# Patient Record
Sex: Male | Born: 1960 | Race: Black or African American | Hispanic: No | Marital: Married | Smoking: Never smoker
Health system: Southern US, Community
[De-identification: ages and names within clinical notes are randomized; demographics above are authoritative.]

## PROBLEM LIST (undated history)

## (undated) DIAGNOSIS — G4733 Obstructive sleep apnea (adult) (pediatric): Secondary | ICD-10-CM

## (undated) DIAGNOSIS — E785 Hyperlipidemia, unspecified: Secondary | ICD-10-CM

## (undated) DIAGNOSIS — I509 Heart failure, unspecified: Secondary | ICD-10-CM

## (undated) DIAGNOSIS — K219 Gastro-esophageal reflux disease without esophagitis: Secondary | ICD-10-CM

## (undated) DIAGNOSIS — Z87442 Personal history of urinary calculi: Secondary | ICD-10-CM

## (undated) DIAGNOSIS — Z9989 Dependence on other enabling machines and devices: Secondary | ICD-10-CM

## (undated) DIAGNOSIS — I5042 Chronic combined systolic (congestive) and diastolic (congestive) heart failure: Secondary | ICD-10-CM

## (undated) DIAGNOSIS — E1142 Type 2 diabetes mellitus with diabetic polyneuropathy: Secondary | ICD-10-CM

## (undated) DIAGNOSIS — M109 Gout, unspecified: Secondary | ICD-10-CM

## (undated) DIAGNOSIS — E119 Type 2 diabetes mellitus without complications: Secondary | ICD-10-CM

## (undated) DIAGNOSIS — J45909 Unspecified asthma, uncomplicated: Secondary | ICD-10-CM

## (undated) DIAGNOSIS — I10 Essential (primary) hypertension: Secondary | ICD-10-CM

## (undated) HISTORY — PX: TONSILLECTOMY: SUR1361

## (undated) HISTORY — DX: Essential (primary) hypertension: I10

## (undated) HISTORY — DX: Morbid (severe) obesity due to excess calories: E66.01

## (undated) HISTORY — DX: Chronic combined systolic (congestive) and diastolic (congestive) heart failure: I50.42

## (undated) HISTORY — DX: Hyperlipidemia, unspecified: E78.5

---

## 1999-04-08 ENCOUNTER — Encounter: Payer: Self-pay | Admitting: Emergency Medicine

## 1999-04-08 ENCOUNTER — Emergency Department (HOSPITAL_COMMUNITY): Admission: EM | Admit: 1999-04-08 | Discharge: 1999-04-08 | Payer: Self-pay | Admitting: Emergency Medicine

## 2001-10-23 ENCOUNTER — Emergency Department (HOSPITAL_COMMUNITY): Admission: EM | Admit: 2001-10-23 | Discharge: 2001-10-24 | Payer: Self-pay | Admitting: Emergency Medicine

## 2001-10-23 ENCOUNTER — Encounter: Payer: Self-pay | Admitting: Emergency Medicine

## 2001-10-28 ENCOUNTER — Emergency Department (HOSPITAL_COMMUNITY): Admission: EM | Admit: 2001-10-28 | Discharge: 2001-10-28 | Payer: Self-pay | Admitting: Emergency Medicine

## 2001-11-14 HISTORY — PX: CARDIAC CATHETERIZATION: SHX172

## 2001-12-02 ENCOUNTER — Inpatient Hospital Stay (HOSPITAL_COMMUNITY): Admission: EM | Admit: 2001-12-02 | Discharge: 2001-12-04 | Payer: Self-pay | Admitting: Emergency Medicine

## 2001-12-02 ENCOUNTER — Encounter: Payer: Self-pay | Admitting: *Deleted

## 2001-12-30 ENCOUNTER — Ambulatory Visit (HOSPITAL_BASED_OUTPATIENT_CLINIC_OR_DEPARTMENT_OTHER): Admission: RE | Admit: 2001-12-30 | Discharge: 2001-12-30 | Payer: Self-pay | Admitting: Internal Medicine

## 2003-01-31 ENCOUNTER — Encounter: Payer: Self-pay | Admitting: Emergency Medicine

## 2003-01-31 ENCOUNTER — Emergency Department (HOSPITAL_COMMUNITY): Admission: EM | Admit: 2003-01-31 | Discharge: 2003-01-31 | Payer: Self-pay | Admitting: Emergency Medicine

## 2005-10-21 ENCOUNTER — Emergency Department (HOSPITAL_COMMUNITY): Admission: EM | Admit: 2005-10-21 | Discharge: 2005-10-21 | Payer: Self-pay | Admitting: Emergency Medicine

## 2010-12-17 ENCOUNTER — Emergency Department (HOSPITAL_COMMUNITY): Payer: PRIVATE HEALTH INSURANCE

## 2010-12-17 ENCOUNTER — Encounter (HOSPITAL_COMMUNITY): Payer: Self-pay | Admitting: Radiology

## 2010-12-17 ENCOUNTER — Inpatient Hospital Stay (HOSPITAL_COMMUNITY)
Admission: EM | Admit: 2010-12-17 | Discharge: 2010-12-18 | DRG: 313 | Disposition: A | Discharge status: Home / Self Care | Payer: PRIVATE HEALTH INSURANCE | Attending: Internal Medicine | Admitting: Internal Medicine

## 2010-12-17 DIAGNOSIS — I10 Essential (primary) hypertension: Secondary | ICD-10-CM | POA: Diagnosis present

## 2010-12-17 DIAGNOSIS — R0789 Other chest pain: Principal | ICD-10-CM | POA: Diagnosis present

## 2010-12-17 DIAGNOSIS — Z7982 Long term (current) use of aspirin: Secondary | ICD-10-CM

## 2010-12-17 DIAGNOSIS — G4733 Obstructive sleep apnea (adult) (pediatric): Secondary | ICD-10-CM | POA: Diagnosis present

## 2010-12-17 DIAGNOSIS — R209 Unspecified disturbances of skin sensation: Secondary | ICD-10-CM | POA: Diagnosis present

## 2010-12-17 DIAGNOSIS — M109 Gout, unspecified: Secondary | ICD-10-CM | POA: Diagnosis present

## 2010-12-17 DIAGNOSIS — E119 Type 2 diabetes mellitus without complications: Secondary | ICD-10-CM | POA: Diagnosis present

## 2010-12-17 LAB — POCT I-STAT, CHEM 8
BUN: 25 mg/dL — ABNORMAL HIGH (ref 6–23)
Calcium, Ion: 1.19 mmol/L (ref 1.12–1.32)
Chloride: 102 mEq/L (ref 96–112)
Creatinine, Ser: 1.4 mg/dL (ref 0.4–1.5)
Glucose, Bld: 204 mg/dL — ABNORMAL HIGH (ref 70–99)
HCT: 44 % (ref 39.0–52.0)
Hemoglobin: 15 g/dL (ref 13.0–17.0)
Potassium: 4.5 mEq/L (ref 3.5–5.1)
Sodium: 135 mEq/L (ref 135–145)
TCO2: 25 mmol/L (ref 0–100)

## 2010-12-17 LAB — DIFFERENTIAL
Basophils Absolute: 0 10*3/uL (ref 0.0–0.1)
Basophils Relative: 0 % (ref 0–1)
Eosinophils Absolute: 0.2 10*3/uL (ref 0.0–0.7)
Eosinophils Relative: 2 % (ref 0–5)
Lymphocytes Relative: 31 % (ref 12–46)
Lymphs Abs: 2.8 10*3/uL (ref 0.7–4.0)
Monocytes Absolute: 0.9 10*3/uL (ref 0.1–1.0)
Monocytes Relative: 11 % (ref 3–12)
Neutro Abs: 5 10*3/uL (ref 1.7–7.7)
Neutrophils Relative %: 56 % (ref 43–77)

## 2010-12-17 LAB — GLUCOSE, CAPILLARY
Glucose-Capillary: 141 mg/dL — ABNORMAL HIGH (ref 70–99)
Glucose-Capillary: 165 mg/dL — ABNORMAL HIGH (ref 70–99)

## 2010-12-17 LAB — POCT CARDIAC MARKERS
CKMB, poc: 5.4 ng/mL (ref 1.0–8.0)
Myoglobin, poc: 202 ng/mL (ref 12–200)
Troponin i, poc: <0.05 ng/mL (ref 0.00–0.09)

## 2010-12-17 LAB — D-DIMER, QUANTITATIVE: D-Dimer, Quant: 1.27 ug/mL-FEU — ABNORMAL HIGH (ref 0.00–0.48)

## 2010-12-17 LAB — CBC
HCT: 40.4 % (ref 39.0–52.0)
Hemoglobin: 13 g/dL (ref 13.0–17.0)
MCV: 95.5 fL (ref 78.0–100.0)
WBC: 8.9 10*3/uL (ref 4.0–10.5)

## 2010-12-17 LAB — TROPONIN I: Troponin I: 0.02 ng/mL (ref 0.00–0.06)

## 2010-12-17 MED ORDER — IOHEXOL 300 MG/ML  SOLN: 100.0000 mL | Freq: Once | INTRAMUSCULAR | Status: AC | PRN

## 2010-12-18 LAB — CBC
MCV: 95.8 fL (ref 78.0–100.0)
Platelets: 195 10*3/uL (ref 150–400)
RDW: 13.3 % (ref 11.5–15.5)
WBC: 7.6 10*3/uL (ref 4.0–10.5)

## 2010-12-18 LAB — COMPREHENSIVE METABOLIC PANEL
ALT: 39 U/L (ref 0–53)
Calcium: 9.4 mg/dL (ref 8.4–10.5)
GFR calc Af Amer: >60 mL/min (ref >60)
Glucose, Bld: 156 mg/dL — ABNORMAL HIGH (ref 70–99)
Sodium: 137 mEq/L (ref 135–145)
Total Protein: 7 g/dL (ref 6.0–8.3)

## 2010-12-18 LAB — GLUCOSE, CAPILLARY
Glucose-Capillary: 164 mg/dL — ABNORMAL HIGH (ref 70–99)
Glucose-Capillary: 182 mg/dL — ABNORMAL HIGH (ref 70–99)

## 2010-12-18 LAB — CK TOTAL AND CKMB (NOT AT ARMC): CK, MB: 3.5 ng/mL (ref 0.3–4.0)

## 2010-12-18 LAB — LIPID PANEL
Cholesterol: 207 mg/dL — ABNORMAL HIGH (ref 0–200)
HDL: 41 mg/dL (ref >39)
Total CHOL/HDL Ratio: 5 RATIO

## 2010-12-18 LAB — TROPONIN I: Troponin I: 0.01 ng/mL (ref 0.00–0.06)

## 2010-12-24 ENCOUNTER — Emergency Department (HOSPITAL_COMMUNITY)
Admission: EM | Admit: 2010-12-24 | Discharge: 2010-12-25 | Disposition: A | Discharge status: Home / Self Care | Payer: PRIVATE HEALTH INSURANCE | Attending: Emergency Medicine | Admitting: Emergency Medicine

## 2010-12-24 DIAGNOSIS — E785 Hyperlipidemia, unspecified: Secondary | ICD-10-CM | POA: Insufficient documentation

## 2010-12-24 DIAGNOSIS — E119 Type 2 diabetes mellitus without complications: Secondary | ICD-10-CM | POA: Insufficient documentation

## 2010-12-24 DIAGNOSIS — K219 Gastro-esophageal reflux disease without esophagitis: Secondary | ICD-10-CM | POA: Insufficient documentation

## 2010-12-24 DIAGNOSIS — Z7982 Long term (current) use of aspirin: Secondary | ICD-10-CM | POA: Insufficient documentation

## 2010-12-24 DIAGNOSIS — R079 Chest pain, unspecified: Secondary | ICD-10-CM | POA: Insufficient documentation

## 2010-12-24 DIAGNOSIS — Z79899 Other long term (current) drug therapy: Secondary | ICD-10-CM | POA: Insufficient documentation

## 2010-12-24 DIAGNOSIS — J3489 Other specified disorders of nose and nasal sinuses: Secondary | ICD-10-CM | POA: Insufficient documentation

## 2010-12-24 DIAGNOSIS — I10 Essential (primary) hypertension: Secondary | ICD-10-CM | POA: Insufficient documentation

## 2010-12-24 LAB — COMPREHENSIVE METABOLIC PANEL
Albumin: 4.2 g/dL (ref 3.5–5.2)
BUN: 26 mg/dL — ABNORMAL HIGH (ref 6–23)
Creatinine, Ser: 1.26 mg/dL (ref 0.4–1.5)
Total Bilirubin: 0.5 mg/dL (ref 0.3–1.2)
Total Protein: 7.7 g/dL (ref 6.0–8.3)

## 2010-12-24 LAB — CBC
MCH: 30 pg (ref 26.0–34.0)
MCV: 94.5 fL (ref 78.0–100.0)
Platelets: 223 10*3/uL (ref 150–400)
RDW: 13.4 % (ref 11.5–15.5)

## 2010-12-24 LAB — DIFFERENTIAL
Eosinophils Absolute: 0.2 10*3/uL (ref 0.0–0.7)
Eosinophils Relative: 2 % (ref 0–5)
Lymphs Abs: 3.2 10*3/uL (ref 0.7–4.0)
Monocytes Relative: 10 % (ref 3–12)

## 2010-12-24 LAB — POCT CARDIAC MARKERS: Troponin i, poc: <0.05 ng/mL (ref 0.00–0.09)

## 2010-12-28 NOTE — H&P (Signed)
  Miguel Ho, Miguel Ho             ACCOUNT NO.:  [id number]  MEDICAL RECORD NO.:  [id number]           PATIENT TYPE:  E  LOCATION:  [facility]                         FACILITY:  [facility]  PHYSICIAN:  Baltazar Najjar, MD     DATE OF BIRTH:  Jul 14, 1961  DATE OF ADMISSION:  12/17/2010 DATE OF DISCHARGE:                             HISTORY & PHYSICAL   ADDENDUM  Left-sided numbness without weakness, which was transient and nonspecific, however, given his risk factor, I will order for CT of the head.  He will also probably need TIA workup, so I will go ahead and order for carotid Doppler and a 2-D echocardiogram.  The patient is currently on aspirin, we will continue after checking his CT of head.  Dr. Jacinto Halim from [facility] was consulted and he will kindly see the patient in consultation.          ______________________________ Baltazar Najjar, MD     SA/MEDQ  D:  12/17/2010  T:  12/17/2010  Job:  [id number]  cc:   Della Goo, M.D.  Electronically Signed by Hannah Beat MD on 12/28/2010 08:22:13 PM

## 2011-01-20 NOTE — H&P (Signed)
NAMELOUIS, Miguel             ACCOUNT NO.:  [id number]  MEDICAL RECORD NO.:  [id number]           PATIENT TYPE:  E  LOCATION:  [facility]                         FACILITY:  [facility]  PHYSICIAN:  Celene Kras, MD       DATE OF BIRTH:  1961-02-22  DATE OF ADMISSION:  12/17/2010 DATE OF DISCHARGE:                             HISTORY & PHYSICAL   PRIMARY CARE PHYSICIAN:  Della Goo, MD  CODE STATUS:  The patient is full code.  REASON FOR ADMISSION:  Chest pain.  HISTORY OF PRESENT ILLNESS:  Ms. Miguel Ho is a 50 year old pleasant African American man with history of diabetes, hypertension, and nonischemic cardiomyopathy.  The patient came into the ER today with a chief complaint of chest pain started around 11 p.m. last night and he described his pain as chest pressure, mostly on his left side.  As per him, he slept overnight, wake up this morning, and his chest pain persists; however, the severity of the pain came down from about 7-8 last night to about 3 this morning.  As per him, he had transient episode of left upper extremity numbness associated with his pain and he felt dizzy and nauseated.  However, he was transient that went away in few seconds.  This morning, he has had left-sided numbness, which also resolved.  He denies any heartburn on a epigastric pain or any GI symptoms such as change in his bowel habits.  No nausea or vomiting. His episodes also associated with stranding shortness of breath as per him, which had resolved.  In the ED, his EKG was unremarkable.  His cardiac enzymes were unremarkable.  However, his D-dimer mildly elevated and CTA of the chest was ordered to rule out PE.  We were asked to see him for admission.  PAST MEDICAL HISTORY: 1. Diabetes mellitus type 2 2. Hypertension. 3. History of nonischemic cardiomyopathy, had cardiac catheterization     back in 2003, which showed normal coronaries. 4. History of gout. 5.  Hyperlipidemia.  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  Denies smoking or illicit drug use.  He drinks occasionally.  ALLERGIES:  No known drug allergies.  HOME MEDICATIONS:  The patient was unable to remember all his medication/dosages.  Medication reconciliation is pending at the time of dictation.  However as per him.  He was able to mention; 1. Glipizide. 2. Allopurinol. 3. Lisinopril. 4. Baby aspirin. 5. Cholesterol pills. 6. Lasix. 7. KCl.  FAMILY HISTORY:  Significant for cardiac disease.  His father died of heart attack and his father was also diabetic and hypertensive.  REVIEW OF SYSTEMS:  As above in HPI. CHEST:  Significant for shortness of breath transient.  No cough. CARDIOVASCULAR:  Chest pressure/pain.  No palpitation. ABDOMEN:  No nausea, vomiting, or change in bowel habits.  He had mild transient nausea, last night, which had resolved. GENITOURINARY: No burning micturition.  No urinary frequency or flank pain. CONSTITUTIONAL:  No fever or chills.  PHYSICAL EXAMINATION:  VITAL SIGNS:  Blood pressure 120/75, pulse 102, temperature was 98.2, and O2 sat 99% on  room air. GENERAL:  He is alert, not in acute distress. NECK:  Supple.  No JVD. LUNGS:  Clear to auscultation bilaterally. CARDIOVASCULAR:  S1 and S2.  Regular rhythm and rate. ABDOMEN:  Obese, soft, and nontender. EXTREMITIES:  No pedal edema. NEUROLOGIC:  He is alert and oriented x3.  No focal neurological deficit appreciated.  LABORATORY DATA:  Cardiac markers, troponin less than 0.05 x2 and D- dimer 1.27.  CBC showed hemoglobin of 13, hematocrit of 40, WBCs of 8.9, and platelet count 209.  Creatinine 1.4, BUN 25, glucose was 204, potassium 4.5, and sodium 135.  RADIOLOGY/IMAGING:  Chest x-ray showed borderline cardiomegaly without acute disease.  EKG showed sinus tachycardia with ventricular rate of 117 bpm.  ASSESSMENT AND PLAN:  Mr.  Miguel Ho is a 50 year old African American man with the above medical history presented with chest pain. 1. Chest pain.  The patient will be admitted to telemetry.  We will     continue to cycle his cardiac enzymes. 2. Given the fact that, the patient has significant risk factors being     a diabetic and hypertensive with a history of hyperlipidemia.  I     think he would benefit from Cardiology evaluation.  He will     probably need a stress test for evaluation and noted that the     patient had a previous history.  He had previous cardiac     catheterization back in 2003, that showed normal coronaries and it     was done by Dr. Jacinto Halim and I will consult him for to see him while     he is in the hospital. 3. Follow CTA to rule out PE given mildly elevated D-dimer. 4. We will continue with aspirin.  I will add small dose of beta-     blocker.  Continue with ACE inhibitor and statin. 5. Diabetes mellitus type 2.  Check his hemoglobin A1c.  Monitor CBGs     very closely.  Continue his home medicine and adjust p.r.n. 6. Hypertension.  Continue lisinopril at small dose of beta-blocker,     if his blood pressure can tolerate, and monitor adjust p.r.n. 7. History of nonischemic cardiomyopathy.  Cardiology be consulted for     further imaging/studies. 8. History of gout, on allopurinol.  No acute issues. 9. History of obstructive sleep apnea with CPAP.  We will continue     with CPAP at home. 10.Prophylaxis with heparin and Protonix. 11.Code status.  The patient is full code.    ______________________________ Baltazar Najjar, MD   ______________________________ Celene Kras, MD    SA/MEDQ  D:  12/17/2010  T:  12/17/2010  Job:  [id number]  cc:   Della Goo, M.D.  Electronically Signed by Hannah Beat MD on 01/19/2011 09:08:14 PM

## 2011-03-02 ENCOUNTER — Emergency Department (HOSPITAL_COMMUNITY)
Admission: EM | Admit: 2011-03-02 | Discharge: 2011-03-03 | Disposition: A | Discharge status: Home / Self Care | Payer: PRIVATE HEALTH INSURANCE | Attending: Emergency Medicine | Admitting: Emergency Medicine

## 2011-03-02 ENCOUNTER — Emergency Department (HOSPITAL_COMMUNITY): Payer: PRIVATE HEALTH INSURANCE

## 2011-03-02 DIAGNOSIS — E785 Hyperlipidemia, unspecified: Secondary | ICD-10-CM | POA: Insufficient documentation

## 2011-03-02 DIAGNOSIS — R0789 Other chest pain: Secondary | ICD-10-CM | POA: Insufficient documentation

## 2011-03-02 DIAGNOSIS — E119 Type 2 diabetes mellitus without complications: Secondary | ICD-10-CM | POA: Insufficient documentation

## 2011-03-02 DIAGNOSIS — I10 Essential (primary) hypertension: Secondary | ICD-10-CM | POA: Insufficient documentation

## 2011-03-02 DIAGNOSIS — E669 Obesity, unspecified: Secondary | ICD-10-CM | POA: Insufficient documentation

## 2011-03-02 LAB — POCT CARDIAC MARKERS: Myoglobin, poc: 109 ng/mL (ref 12–200)

## 2011-03-02 LAB — POCT I-STAT, CHEM 8
Creatinine, Ser: 1.3 mg/dL (ref 0.4–1.5)
HCT: 39 % (ref 39.0–52.0)
Hemoglobin: 13.3 g/dL (ref 13.0–17.0)
Sodium: 137 mEq/L (ref 135–145)
TCO2: 25 mmol/L (ref 0–100)

## 2011-03-03 LAB — POCT CARDIAC MARKERS
CKMB, poc: 2 ng/mL (ref 1.0–8.0)
Myoglobin, poc: 112 ng/mL (ref 12–200)

## 2011-03-14 NOTE — Discharge Summary (Signed)
  [name], Miguel Ho             ACCOUNT NO.:  [id number]  MEDICAL RECORD NO.:  [id number]           PATIENT TYPE:  I  LOCATION:  3731                         FACILITY:  [facility]  PHYSICIAN:  Hollice Espy, M.D.DATE OF BIRTH:  23-Feb-1961  DATE OF ADMISSION:  12/17/2010 DATE OF DISCHARGE:  12/18/2010                              DISCHARGE SUMMARY   PRIMARY CARE PHYSICIAN:  Della Goo, MD  DISCHARGE DIAGNOSES: 1. Chest pain felt to be musculoskeletal, noncardiac. 2. Left arm numbness, resolved, felt to be more musculoskeletal.  No     indications of any transient ischemic attack or cerebrovascular     accident. 3. Hypertension. 4. History of sleep apnea. 5. History of diabetes mellitus type 2.  Please note that all the diagnoses were present on admission.  DISCHARGE MEDICATIONS: 1. Aspirin 81. 2. Allopurinol 300. 3. Norvasc 2.5. 4. Fish oil 1400. 5. Lasix 40 mg daily as needed. 6. Glipizide 5 p.o. b.i.d. 7. K-Dur 20 mEq p.o. daily. 8. Lisinopril 10 p.o. daily. 9. Lovastatin 40 p.o. at bedtime.  DISCHARGE ACTIVITY:  Increase activity slowly.  DISCHARGE DIET:  Low-sodium diet.  FOLLOWUP:  The patient will follow up with the PCP, Dr. Della Goo, in the next 1 week.  Follow up with Dr. Jacinto Halim from [facility] in the next 1 week.  HOSPITAL COURSE:  The patient is a 50 year old African American male with a past medical history of diabetes, hypertension, and ischemic cardiomyopathy who came to the ER complaining of chest pain as well as some left arm numbness.  He was evaluated.  Cardiac enzymes x3 were negative.  He had a previous cardiac catheterization done in 2003 after his enzymes were negative.  It was discussed with Dr. Jacinto Halim who felt that the patient could follow up with an outpatient followup for consideration of a stress test at that time.  His other medical issues were stable during his hospitalization.  He had some arm numbness,  this was not felt to be that way cardiac related.  He had a CT scan of the head which was negative ruling out any kind of stroke.  There was a concern that this could be TIA related but evidence was not shown, so this patient was continued on his daily aspirin and then he was discharged to home with followup with his PCP as well as Dr. Jacinto Halim.  DISPOSITION:  Improved.     Hollice Espy, M.D.     SKK/MEDQ  D:  03/11/2011  T:  03/12/2011  Job:  [id number]  cc:   Della Goo, M.D. Cristy Hilts. Jacinto Halim, MD  Electronically Signed by Virginia Rochester M.D. on 03/14/2011 11:02:19 AM

## 2011-10-04 ENCOUNTER — Other Ambulatory Visit: Payer: Self-pay

## 2011-10-04 DIAGNOSIS — I739 Peripheral vascular disease, unspecified: Secondary | ICD-10-CM

## 2011-10-11 ENCOUNTER — Encounter: Payer: Self-pay | Admitting: Vascular Surgery

## 2011-10-26 ENCOUNTER — Encounter: Payer: Self-pay | Admitting: Vascular Surgery

## 2011-10-27 ENCOUNTER — Encounter: Payer: Self-pay | Admitting: Vascular Surgery

## 2011-10-27 ENCOUNTER — Ambulatory Visit (INDEPENDENT_AMBULATORY_CARE_PROVIDER_SITE_OTHER): Payer: PRIVATE HEALTH INSURANCE | Admitting: Vascular Surgery

## 2011-10-27 VITALS — BP 106/71 | HR 73 | Resp 20 | Ht 69.5 in | Wt 322.8 lb

## 2011-10-27 DIAGNOSIS — R200 Anesthesia of skin: Secondary | ICD-10-CM

## 2011-10-27 DIAGNOSIS — R209 Unspecified disturbances of skin sensation: Secondary | ICD-10-CM

## 2011-10-27 DIAGNOSIS — I739 Peripheral vascular disease, unspecified: Secondary | ICD-10-CM

## 2011-10-27 NOTE — Progress Notes (Signed)
ABI/TBI performed @ [facility] 10/27/2011

## 2011-10-27 NOTE — Progress Notes (Signed)
[facility] [place] HISTORY AND PHYSICAL   History of Present Illness:  Patient is a 50 y.o. year old male who presents for evaluation of numbness and tingling in his feet.  The patient has a history of diabetes for greater than 10 years. He has developed intermittent numbness and tingling in both feet over the last 2 months. He states his symptoms he thinks this is related to greasy foods. He does have a history of gout but states this is not the same pain. He has no trouble walking. He has no claudication symptoms. He has no rest pain. He has no history of nonhealing ulcers. He rides a stationary bicycle every morning. He is losing weight. He has gone from 340 pounds to 323 pounds. He is considering lap banding. He realizes that his obesity is contributing to his medical problems. Other medical problems include hypertension, sleep apnea in addition to the above problems. These are currently controlled and followed by his primary care physician.  Past Medical History  Diagnosis Date  . Hypertension   . History of gout   . History of sleep apnea   . Diabetes mellitus   . Numbness of toes     Past Surgical History  Procedure Date  . Cardiac catheterization 2003     Social History History  Substance Use Topics  . Smoking status: Never Smoker   . Smokeless tobacco: Not on file  . Alcohol Use: Yes     occasionally    Family History Family History  Problem Relation Age of Onset  . Heart attack Father   . Hypertension Father   . Diabetes Father   . Heart disease Father   . Cancer Sister     leukemia    Allergies  No Known Allergies   Current Outpatient Prescriptions  Medication Sig Dispense Refill  . allopurinol (ZYLOPRIM) 300 MG tablet Take 300 mg by mouth daily.        Marland Kitchen amLODipine (NORVASC) 2.5 MG tablet Take 2.5 mg by mouth at bedtime.        Marland Kitchen aspirin EC 81 MG tablet Take 81 mg by mouth daily.        . carvedilol (COREG) 6.25 MG tablet Take 6.25  mg by mouth 2 (two) times daily with a meal.        . furosemide (LASIX) 40 MG tablet Take 40 mg by mouth daily.        Marland Kitchen glipiZIDE (GLUCOTROL) 5 MG tablet Take 5 mg by mouth 2 (two) times daily before a meal.        . lisinopril (PRINIVIL,ZESTRIL) 10 MG tablet Take 10 mg by mouth daily.        Marland Kitchen lovastatin (MEVACOR) 40 MG tablet Take 40 mg by mouth at bedtime.        . Omega-3 Fatty Acids (FISH OIL TRIPLE STRENGTH) 1400 MG CAPS Take 1,400 mg by mouth daily.        . potassium chloride SA (K-DUR,KLOR-CON) 20 MEQ tablet Take 20 mEq by mouth daily.          ROS:   General:  + weight loss, Fever, chills  HEENT: No recent headaches, no nasal bleeding, no visual changes, no sore throat  Neurologic: No dizziness, blackouts, seizures. No recent symptoms of stroke or mini- stroke. No recent episodes of slurred speech, or temporary blindness.  Cardiac: occasional chest pain/pressure, no shortness of breath at rest.  No shortness of breath with exertion.  Denies  history of atrial fibrillation or irregular heartbeat  Vascular: No history of rest pain in feet.  No history of claudication.  No history of non-healing ulcer, No history of DVT   Pulmonary: No home oxygen, no productive cough, no hemoptysis,  No asthma or wheezing  Musculoskeletal:  [ ]  Arthritis, [ ]  Low back pain,  [ ]  Joint pain  Hematologic:No history of hypercoagulable state.  No history of easy bleeding.  No history of anemia  Gastrointestinal: No hematochezia or melena,  No gastroesophageal reflux, no trouble swallowing  Urinary: [ ]  chronic Kidney disease, [ ]  on HD - [ ]  MWF or [ ]  TTHS, [ ]  Burning with urination, [ ]  Frequent urination, [ ]  Difficulty urinating;   Skin: No rashes  Psychological: No history of anxiety,  No history of depression   Physical Examination  Filed Vitals:   10/27/11 1219  BP: 106/71  Pulse: 73  Resp: 20  Height: 5' 9.5" (1.765 m)  Weight: 322 lb 12.8 oz (146.421 kg)    Body mass  index is 46.99 kg/(m^2).  General:  Alert and oriented, no acute distress HEENT: Normal Neck: No bruit or JVD Pulmonary: Clear to auscultation bilaterally Cardiac: Regular Rate and Rhythm without murmur Abdomen: Soft, non-tender, non-distended, no mass, no scars, obese Skin: No rash Extremity Pulses:  2+ radial, brachial, femoral, dorsalis pedis, posterior tibial pulses bilaterally Musculoskeletal: No deformity or edema  Neurologic: Upper and lower extremity motor 5/5 and symmetric  DATA: The patient had bilateral ABIs today which I reviewed and interpreted. ABIs are greater than 1 bilaterally with triphasic waveforms. His toe pressure with slightly decreased but still within the normal range.   ASSESSMENT: Numbness and tingling of both feet most likely related to diabetic peripheral neuropathy. No evidence of large vessel arterial occlusive disease at this point.   PLAN: Patient was counseled and risk factor modification as well as continued weight loss. He'll try to control his diabetes. We'll check his feet daily. We will also plan moisturizing lotion to the feet prevent cracking. He will inspect his feet daily and avoid going barefoot. He will followup with me when necessary.  Fabienne Bruns, MD [facility] [place] Office: [phone] Pager: [phone]

## 2015-03-23 ENCOUNTER — Encounter: Payer: 59 | Attending: Internal Medicine | Admitting: Dietician

## 2015-03-23 ENCOUNTER — Encounter: Payer: Self-pay | Admitting: Dietician

## 2015-03-23 VITALS — Ht 69.5 in | Wt 303.0 lb

## 2015-03-23 DIAGNOSIS — E1165 Type 2 diabetes mellitus with hyperglycemia: Secondary | ICD-10-CM

## 2015-03-23 DIAGNOSIS — IMO0002 Reserved for concepts with insufficient information to code with codable children: Secondary | ICD-10-CM

## 2015-03-23 DIAGNOSIS — Z794 Long term (current) use of insulin: Secondary | ICD-10-CM | POA: Diagnosis not present

## 2015-03-23 DIAGNOSIS — Z713 Dietary counseling and surveillance: Secondary | ICD-10-CM | POA: Insufficient documentation

## 2015-03-23 DIAGNOSIS — E118 Type 2 diabetes mellitus with unspecified complications: Secondary | ICD-10-CM | POA: Diagnosis present

## 2015-03-23 NOTE — Patient Instructions (Signed)
Exercise most days of the week.  Walking or biking for 30-60 minutes each time as tolerated. Aim for 4-5 Carb Choices per meal (60-75 grams) +/- 1 either way  Aim for 0-2 Carbs per snack if hungry  Include protein in moderation with your meals and snacks Consider reading food labels for Total Carbohydrate and Fat Grams of foods Consider checking BG at alternate times per day as directed by MD  Consider taking medication as directed by MD

## 2015-03-24 NOTE — Progress Notes (Signed)
Diabetes Self-Management Education  Visit Type:    Appt. Start Time: 1545 Appt. End Time: 1700  03/24/2015  [name], identified by name and date of birth, is a 54 y.o. male with a diagnosis of Diabetes: Type 2.  Other people present during visit:  Patient   Patient reports hx of type 2 diabetes for >10 years.  HgbA1C increased from 7.5% 11/12/15 to 9.6% 02/12/15.  Patiend states that he has decreased sugar containing beverages.  Reports that when he eats red meat or spicy foods he has increased symptoms of neuropathy.  Follows a reduced sodium diet and uses a low sodium seasoning blend and Mrs. Dash.  Hx also includes OSA on C-Pap.  Patient did not use C-Pap last night and had a difficult time staying awake during his appointment.  Current meds include:  Victoza 1.5 mg after dinner,  Levemir 60 units before breakfast, and Short acting insulin 10 units before breakfast if blood sugar is high (>160).  Patient reports that his weight was 320 lbs 1 year ago.  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  He does his own cooking and shopping.    ASSESSMENT  Height 5' 9.5" (1.765 m), weight 303 lb (137.44 kg). Body mass index is 44.12 kg/(m^2).  Initial Visit Information:  Are you currently following a meal plan?: Yes What type of meal plan do you follow?: trying to avoid a lot of starches Are you taking your medications as prescribed?: Yes Are you checking your feet?: Yes How many days per week are you checking your feet?: 7 How often do you need to have someone help you when you read instructions, pamphlets, or other written materials from your doctor or pharmacy?: 1 - Never What is the last grade level you completed in school?: 12 grade HS  Psychosocial:   Patient Belief/Attitude about Diabetes: Motivated to manage diabetes Self-care barriers: None Self-management support: Doctor's office, Family Other persons present: Patient Patient Concerns: Nutrition/Meal planning, Healthy  Lifestyle Special Needs: None Preferred Learning Style: No preference indicated Learning Readiness: Ready  Complications:   Last HgB A1C per patient/outside source: 9.6 mg/dL (4/62/70) How often do you check your blood sugar?: 1-2 times/day Fasting Blood glucose range (mg/dL): 350-093, 818-299 (371-696) Postprandial Blood glucose range (mg/dL): 78-938 (101-751) Number of hypoglycemic episodes per month: 0 Number of hyperglycemic episodes per week: 14 Can you tell when your blood sugar is high?: Yes What do you do if your blood sugar is high?: numb feet, "puffy/swollen" Have you had a dilated eye exam in the past 12 months?: No Have you had a dental exam in the past 12 months?: Yes  Diet Intake:  Breakfast: 2 boiled eggs and water or lplain oatmeal with stevia or splenda or eggs, bacon and grits or occasional french toast with sugar free syrup with 2% milk, rare unsweetened cereal with milk Lunch: pickle and lima beans (1100) Snack (afternoon): nabs or chicken spinach wrap  (1300) Dinner: [place], rice with gravy butter beans Snack (evening): pickle or meat  Beverage(s): water, sugar free coolaide, crystal light, rare coffee with cream and 3 packs sugar or splenda, occasional iced tea with lemonaide  Exercise:  Exercise: Light (walking / raking leaves) (walking or stationary bike) Light Exercise amount of time (min / week): 150  Individualized Plan for Diabetes Self-Management Training:   Learning Objective:  Patient will have a greater understanding of diabetes self-management.  Patient education plan per assessed needs and concerns is to attend individual sessions  for     Education Topics Reviewed with Patient Today:  Definition of diabetes, type 1 and 2, and the diagnosis of diabetes Role of diet in the treatment of diabetes and the relationship between the three main macronutrients and blood glucose level, Food label reading, portion sizes and measuring food., Carbohydrate  counting Role of exercise on diabetes management, blood pressure control and cardiac health.       Relationship between chronic complications and blood glucose control, Dental care, Retinopathy and reason for yearly dilated eye exams        PATIENTS GOALS/Plan (Developed by the patient):  Nutrition: General guidelines for healthy choices and portions discussed, Follow meal plan discussed Physical Activity: Exercise 5-7 days per week, 45 minutes per day Medications: take my medication as prescribed Monitoring : test my blood glucose as discussed (note x per day with comment) (bid)  Plan:   Patient Instructions  Exercise most days of the week.  Walking or biking for 30-60 minutes each time as tolerated. Aim for 4-5 Carb Choices per meal (60-75 grams) +/- 1 either way  Aim for 0-2 Carbs per snack if hungry  Include protein in moderation with your meals and snacks Consider reading food labels for Total Carbohydrate and Fat Grams of foods Consider checking BG at alternate times per day as directed by MD  Consider taking medication as directed by MD    Expected Outcomes:  Demonstrated interest in learning. Expect positive outcomes  Education material provided: Living Well with Diabetes, Food label handouts, A1C conversion sheet, Meal plan card and Snack sheet  If problems or questions, patient to contact team via:  Phone and Email  Future DSME appointment: PRN

## 2015-09-18 ENCOUNTER — Telehealth: Payer: Self-pay | Admitting: Cardiovascular Disease

## 2015-09-18 NOTE — Telephone Encounter (Signed)
Received records from Dr Ralene Ok for appointment on 09/23/15 with Dr Duke Salvia.  Records given to [facility] (medical records) for Dr Leonides Sake schedule on 09/23/15. lp

## 2015-09-22 NOTE — Progress Notes (Signed)
This encounter was created in error - please disregard.

## 2015-09-23 ENCOUNTER — Encounter: Payer: PRIVATE HEALTH INSURANCE | Admitting: Cardiovascular Disease

## 2015-09-29 ENCOUNTER — Encounter: Payer: Self-pay | Admitting: Cardiovascular Disease

## 2015-10-09 NOTE — Progress Notes (Signed)
Cardiology Office Note   Date:  10/12/2015   ID:  BRODE SCULLEY, DOB 02-18-1961, MRN [id number]  PCP:  Ron Parker, MD  Cardiologist:   Madilyn Hook, MD   Chief Complaint  Patient presents with  . New Evaluation    pt states no chest pain no SOB no swelling in legs no light headedness or dizziness      History of Present Illness: Miguel Ho is a 54 y.o. malewith hypertension, hyperlipidemia, OSA, and diabetes who presents for management of chronic systolic heart failure.  Mr. Merkey reports that he was diagnosed with systolic heart failure several years ago. He thinks it was in the early 2000 but is unsure. He reports undergoing cardiac catheterization that did not reveal any significant blockages.  E followed up with a cardiologist for short period of time but has not been seen in several years.  He reported this to his primary care physician who recommended that he see a cardiologist.  Mr. Zukowski notes occasional lower extremity edema that occurs based on what he eats.  He likes to eat a lot o fpork and reports that this increases his edema. He denies any chest pain, shortness of breath, or orthopnea.  He previously lost weight to a nadir of 230 pounds. However he has gained much of this weight back due to lack of activity and poor eating habits. He denies weight gain due to edema. He does not get much exercise and reports that his job is very strenuous. 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   Past Medical History  Diagnosis Date  . Hypertension   . History of gout   . History of sleep apnea   . Diabetes mellitus   . Numbness of toes   . History of high cholesterol   . Essential hypertension 10/12/2015  . Hyperlipidemia 10/12/2015  . Morbid obesity (HCC) 10/12/2015  . Chronic systolic heart failure (HCC) 10/12/2015  . Diabetes mellitus (HCC) 10/12/2015    Past Surgical History  Procedure Laterality Date  . Cardiac  catheterization  2003     Current Outpatient Prescriptions  Medication Sig Dispense Refill  . allopurinol (ZYLOPRIM) 300 MG tablet Take 300 mg by mouth daily.      Marland Kitchen aspirin EC 81 MG tablet Take 81 mg by mouth daily.      . carvedilol (COREG) 6.25 MG tablet Take 6.25 mg by mouth 2 (two) times daily with a meal.      . furosemide (LASIX) 40 MG tablet Take 40 mg by mouth daily.      . Gabapentin, Once-Daily, 300 MG TABS Take 300 mg by mouth daily.    Marland Kitchen glipiZIDE (GLUCOTROL) 5 MG tablet Take 5 mg by mouth 2 (two) times daily before a meal.      . insulin detemir (LEVEMIR) 100 UNIT/ML injection Inject 60 Units into the skin daily before breakfast.    . lisinopril (PRINIVIL,ZESTRIL) 10 MG tablet Take 10 mg by mouth daily.      Marland Kitchen lovastatin (MEVACOR) 40 MG tablet Take 40 mg by mouth at bedtime.      . Omega-3 Fatty Acids (FISH OIL TRIPLE STRENGTH) 1400 MG CAPS Take 1,400 mg by mouth daily.      . potassium chloride (K-DUR) 10 MEQ tablet Take 10 mEq by mouth 2 (two) times daily.    . potassium chloride SA (K-DUR,KLOR-CON) 20 MEQ tablet Take 20 mEq by mouth daily.  No current facility-administered medications for this visit.    Allergies:   Review of patient's allergies indicates no known allergies.    Social History:  The patient  reports that he has never smoked. He does not have any smokeless tobacco history on file. He reports that he drinks alcohol. He reports that he does not use illicit drugs.   Family History:  The patient's family history includes Cancer in his sister; Diabetes in his father; Heart attack in his father, mother, and paternal grandmother; Heart disease in his father; Hypertension in his father.    ROS:  Please see the history of present illness.   Otherwise, review of systems are positive for none.   All other systems are reviewed and negative.    PHYSICAL EXAM: VS:  BP 142/90 mmHg  Pulse 105  Ht 5' 9.5" (1.765 m)  Wt 142.566 kg (314 lb 4.8 oz)  BMI 45.76  kg/m2 , BMI Body mass index is 45.76 kg/(m^2). GENERAL:  Well appearing HEENT:  Pupils equal round and reactive, fundi not visualized, oral mucosa unremarkable NECK:  No jugular venous distention, waveform within normal limits, carotid upstroke brisk and symmetric, no bruits, no thyromegaly LYMPHATICS:  No cervical adenopathy LUNGS:  Clear to auscultation bilaterally HEART:  RRR.  PMI not displaced or sustained,S1 and S2 within normal limits, no S3, no S4, no clicks, no rubs, no murmurs ABD:  Flat, positive bowel sounds normal in frequency in pitch, no bruits, no rebound, no guarding, no midline pulsatile mass, no hepatomegaly, no splenomegaly EXT:  2 plus pulses throughout, trace edema, no cyanosis no clubbing SKIN:  No rashes no nodules NEURO:  Cranial nerves II through XII grossly intact, motor grossly intact throughout PSYCH:  Cognitively intact, oriented to person place and time    EKG:  EKG is ordered today. The ekg ordered today demonstrates Sinus tachycardia rate 105 bpm.  low voltage in the limb and precordial leads.  .   Recent Labs: No results found for requested labs within last 365 days.    Lipid Panel    Component Value Date/Time   CHOL * 12/18/2010 0425    207        ATP III CLASSIFICATION:  <200     mg/dL   Desirable  161-096  mg/dL   Borderline High  >=045    mg/dL   High          TRIG 409* 12/18/2010 0425   HDL 41 12/18/2010 0425   CHOLHDL 5.0 12/18/2010 0425   VLDL 70* 12/18/2010 0425   LDLCALC  12/18/2010 0425    96        Total Cholesterol/HDL:CHD Risk Coronary Heart Disease Risk Table                     Men   Women  1/2 Average Risk   3.4   3.3  Average Risk       5.0   4.4  2 X Average Risk   9.6   7.1  3 X Average Risk  23.4   11.0        Use the calculated Patient Ratio above and the CHD Risk Table to determine the patient's CHD Risk.        ATP III CLASSIFICATION (LDL):  <100     mg/dL   Optimal  811-914  mg/dL   Near or Above  Optimal  130-159  mg/dL   Borderline  629-476  mg/dL   High  >546     mg/dL   Very High      Wt Readings from Last 3 Encounters:  10/12/15 142.566 kg (314 lb 4.8 oz)  03/23/15 137.44 kg (303 lb)  10/27/11 146.421 kg (322 lb 12.8 oz)      ASSESSMENT AND PLAN:  # Chronic systolic heart failure: Mr. Bielec appears to be euvolemic on exam today.  It sounds as though he has a non-ischemic cardiomyopathy.  We will obtain an echo to assess his systolic function.  He is already on carvedilol and lisinopril.His blood pressure is slightly elevated today. We will wait to get the results of his echo before titrating one of these medicines or adding spironolactone. He is to continue Lasix at current dosing.  # Hypertension: Blood pressure poorly-controlled as above.  We will obtain his echo results prior to titrating his antihypertensives.  # Hyperlipidemia: Continue lovastatin.  Managed by his PCP.  # Obesity: we discussed the importance of increasing his physical activity to 30-40 minutes most days of the week.  He also admits to having a poor diet, as evidenced by his hemoglobin A1c of 9.5%.  We discussed the importance of weight loss and glycemic control.  He plans to lose 10 lb by the next appointment in 6 months.  Current medicines are reviewed at length with the patient today.  The patient does not have concerns regarding medicines.  The following changes have been made:  no change  Labs/ tests ordered today include:   Orders Placed This Encounter  Procedures  . EKG 12-Lead  . ECHOCARDIOGRAM COMPLETE     Disposition:   FU with Raelle Chambers C. Duke Salvia, MD in 6 months.    Signed, Madilyn Hook, MD  10/12/2015 1:54 PM    [facility]

## 2015-10-12 ENCOUNTER — Encounter: Payer: Self-pay | Admitting: Cardiovascular Disease

## 2015-10-12 ENCOUNTER — Ambulatory Visit (INDEPENDENT_AMBULATORY_CARE_PROVIDER_SITE_OTHER): Payer: 59 | Admitting: Cardiovascular Disease

## 2015-10-12 VITALS — BP 142/90 | HR 105 | Ht 69.5 in | Wt 314.3 lb

## 2015-10-12 DIAGNOSIS — I10 Essential (primary) hypertension: Secondary | ICD-10-CM

## 2015-10-12 DIAGNOSIS — Z794 Long term (current) use of insulin: Secondary | ICD-10-CM

## 2015-10-12 DIAGNOSIS — I5042 Chronic combined systolic (congestive) and diastolic (congestive) heart failure: Secondary | ICD-10-CM | POA: Insufficient documentation

## 2015-10-12 DIAGNOSIS — E785 Hyperlipidemia, unspecified: Secondary | ICD-10-CM

## 2015-10-12 DIAGNOSIS — E0800 Diabetes mellitus due to underlying condition with hyperosmolarity without nonketotic hyperglycemic-hyperosmolar coma (NKHHC): Secondary | ICD-10-CM

## 2015-10-12 DIAGNOSIS — E119 Type 2 diabetes mellitus without complications: Secondary | ICD-10-CM | POA: Insufficient documentation

## 2015-10-12 DIAGNOSIS — I5022 Chronic systolic (congestive) heart failure: Secondary | ICD-10-CM

## 2015-10-12 HISTORY — DX: Essential (primary) hypertension: I10

## 2015-10-12 HISTORY — DX: Hyperlipidemia, unspecified: E78.5

## 2015-10-12 HISTORY — DX: Morbid (severe) obesity due to excess calories: E66.01

## 2015-10-12 HISTORY — DX: Chronic combined systolic (congestive) and diastolic (congestive) heart failure: I50.42

## 2015-10-12 NOTE — Patient Instructions (Addendum)
Medication Instructions:  Your physician recommends that you continue on your current medications as directed. Please refer to the Current Medication list given to you today.  Labwork: none  Testing/Procedures: Your physician has requested that you have an echocardiogram. Echocardiography is a painless test that uses sound waves to create images of your heart. It provides your doctor with information about the size and shape of your heart and how well your heart's chambers and valves are working. This procedure takes approximately one hour. There are no restrictions for this procedure. THIS WILL BE DONE AT [address]  Follow-Up: Your physician wants you to follow-up in: 6 month ov You will receive a reminder letter in the mail two months in advance. If you don't receive a letter, please call our office to schedule the follow-up appointment.

## 2015-10-23 ENCOUNTER — Other Ambulatory Visit: Payer: Self-pay

## 2015-10-23 ENCOUNTER — Ambulatory Visit (HOSPITAL_COMMUNITY): Payer: 59 | Attending: Cardiology

## 2015-10-23 DIAGNOSIS — G4733 Obstructive sleep apnea (adult) (pediatric): Secondary | ICD-10-CM | POA: Insufficient documentation

## 2015-10-23 DIAGNOSIS — E119 Type 2 diabetes mellitus without complications: Secondary | ICD-10-CM | POA: Insufficient documentation

## 2015-10-23 DIAGNOSIS — I5022 Chronic systolic (congestive) heart failure: Secondary | ICD-10-CM | POA: Diagnosis present

## 2015-10-23 DIAGNOSIS — E785 Hyperlipidemia, unspecified: Secondary | ICD-10-CM | POA: Insufficient documentation

## 2015-10-23 DIAGNOSIS — I10 Essential (primary) hypertension: Secondary | ICD-10-CM | POA: Insufficient documentation

## 2015-11-04 ENCOUNTER — Telehealth: Payer: Self-pay | Admitting: *Deleted

## 2015-11-04 NOTE — Telephone Encounter (Signed)
Left message to call back  

## 2015-11-04 NOTE — Telephone Encounter (Signed)
Spoke to patient. echo Result given . Verbalized understanding  blood pressure should be below 140/90 most of the time

## 2015-11-04 NOTE — Telephone Encounter (Signed)
Pt is returning Sharon's call ° °Thanks °

## 2015-11-04 NOTE — Telephone Encounter (Signed)
-----   Message from Miguel Si, MD sent at 11/03/2015 12:30 AM EST ----- Echo shows LVEF is 40-45%, which is lower than expected.  It also shows that his heart does not relax completely.  It will be important keep his blood pressure well-controlled.  Keep follow up as scheduled.

## 2016-04-05 NOTE — Progress Notes (Signed)
Cardiology Office Note   Date:  04/08/2016   ID:  Miguel Ho, DOB Jul 07, 1961, MRN [id number]  PCP:  Ralene Ok, MD  Cardiologist:   Chilton Si, MD   Chief Complaint  Patient presents with  . Follow-up    6 months  pt c/o mild swelling in feet depending on what he eats      History of Present Illness: Miguel Ho is a 55 y.o. male with hypertension, hyperlipidemia, OSA, and diabetes who presents for management of chronic systolic heart failure.  Miguel Ho reports that he was diagnosed with systolic heart failure in the early 2000s. He reports undergoing cardiac catheterization that did not reveal any significant blockages.  He followed up with a cardiologist for short period of time but has not been seen in several years.  He had an echo 12/16 that revealed LVEF 40-45% and hypokinesis of the mid-apical anterolateral myocardium.  There was grade 1 diastolic dysfunction.  Overall Miguel Ho has been doing well.  He has noted some LE edema after eating certain foods.  He tries to follow the DASH diet and seasons his food with garlic rather than salt.  He denies orthopnea or PND.   Miguel Ho has also made other dietary changes including eating more oatmeal and drinkng water while reducing sugary foods and drinks.  He notes that his weight is down 16lb, mostly due to dietary changes.  He has not been exercising, though he has a bicycle and a treadmill.   Miguel Ho has a CPAP but does not feel ike the dose is appropriate.  He uses his CPAP off and on.     Past Medical History  Diagnosis Date  . Hypertension   . History of gout   . History of sleep apnea   . Diabetes mellitus   . Numbness of toes   . History of high cholesterol   . Essential hypertension 10/12/2015  . Hyperlipidemia 10/12/2015  . Morbid obesity (HCC) 10/12/2015  . Chronic systolic heart failure (HCC) 10/12/2015  . Diabetes mellitus (HCC) 10/12/2015  . 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 Cardiac catheterization  2003     Current Outpatient Prescriptions  Medication Sig Dispense Refill  . allopurinol (ZYLOPRIM) 300 MG tablet Take 300 mg by mouth daily.      Marland Kitchen aspirin EC 81 MG tablet Take 81 mg by mouth daily.      . carvedilol (COREG) 6.25 MG tablet Take 6.25 mg by mouth 2 (two) times daily with a meal.      . furosemide (LASIX) 40 MG tablet Take 40 mg by mouth daily.      . Gabapentin, Once-Daily, 300 MG TABS Take 300 mg by mouth daily.    Marland Kitchen LEVEMIR FLEXTOUCH 100 UNIT/ML Pen Inject 50-70 Units into the skin daily.  4  . lisinopril (PRINIVIL,ZESTRIL) 10 MG tablet Take 10 mg by mouth daily.      Marland Kitchen lovastatin (MEVACOR) 40 MG tablet Take 40 mg by mouth at bedtime.      . Omega-3 Fatty Acids (FISH OIL TRIPLE STRENGTH) 1400 MG CAPS Take 1,400 mg by mouth daily.      . potassium chloride (K-DUR) 10 MEQ tablet Take 10 mEq by mouth 2 (two) times daily.    Marland Kitchen VICTOZA 18 MG/3ML SOPN Inject 1.8 mg into the skin daily.  3   No current facility-administered medications for this  visit.    Allergies:   Review of patient's allergies indicates no known allergies.    Social History:  The patient  reports that he has never smoked. He does not have any smokeless tobacco history on file. He reports that he drinks alcohol. He reports that he does not use illicit drugs.   Family History:  The patient's family history includes Cancer in his sister; Diabetes in his father; Heart attack in his father, mother, and paternal grandmother; Heart disease in his father; Hypertension in his father.    ROS:  Please see the history of present illness.   Otherwise, review of systems are positive for none.   All other systems are reviewed and negative.    PHYSICAL EXAM: VS:  BP 92/70 mmHg  Pulse 90  Ht 5\' 9"  (1.753 m)  Wt 135.444 kg (298 lb 9.6 oz)  BMI 44.08 kg/m2 , BMI Body mass index is 44.08  kg/(m^2). GENERAL:  Well appearing HEENT:  Pupils equal round and reactive, fundi not visualized, oral mucosa unremarkable NECK:  No jugular venous distention, waveform within normal limits, carotid upstroke brisk and symmetric, no bruits, no thyromegaly LYMPHATICS:  No cervical adenopathy LUNGS:  Clear to auscultation bilaterally HEART:  RRR.  PMI not displaced or sustained,S1 and S2 within normal limits, no S3, no S4, no clicks, no rubs, no murmurs ABD:  Flat, positive bowel sounds normal in frequency in pitch, no bruits, no rebound, no guarding, no midline pulsatile mass, no hepatomegaly, no splenomegaly EXT:  2 plus pulses throughout, trace edema, no cyanosis no clubbing SKIN:  No rashes no nodules NEURO:  Cranial nerves II through XII grossly intact, motor grossly intact throughout PSYCH:  Cognitively intact, oriented to person place and time   EKG:  EKG is ordered today. The ekg ordered today demonstrates Sinus tachycardia rate 105 bpm.  low voltage in the limb and precordial leads.   Echo 10/23/15: Study Conclusions  - Left ventricle: The cavity size was normal. Wall thickness was  increased in a pattern of mild LVH. Systolic function was mildly  to moderately reduced. The estimated ejection fraction was in the  range of 40% to 45%. There is hypokinesis of the  mid-apicalanterolateral myocardium. Doppler parameters are  consistent with abnormal left ventricular relaxation (grade 1  diastolic dysfunction). .   Recent Labs: No results found for requested labs within last 365 days.    Lipid Panel    Component Value Date/Time   CHOL * 12/18/2010 0425    207        ATP III CLASSIFICATION:  <200     mg/dL   Desirable  161-096  mg/dL   Borderline High  >=045    mg/dL   High          TRIG 409* 12/18/2010 0425   HDL 41 12/18/2010 0425   CHOLHDL 5.0 12/18/2010 0425   VLDL 70* 12/18/2010 0425   LDLCALC  12/18/2010 0425    96        Total Cholesterol/HDL:CHD  Risk Coronary Heart Disease Risk Table                     Men   Women  1/2 Average Risk   3.4   3.3  Average Risk       5.0   4.4  2 X Average Risk   9.6   7.1  3 X Average Risk  23.4   11.0  Use the calculated Patient Ratio above and the CHD Risk Table to determine the patient's CHD Risk.        ATP III CLASSIFICATION (LDL):  <100     mg/dL   Optimal  356-701  mg/dL   Near or Above                    Optimal  130-159  mg/dL   Borderline  410-301  mg/dL   High  >314     mg/dL   Very High      Wt Readings from Last 3 Encounters:  04/07/16 135.444 kg (298 lb 9.6 oz)  10/12/15 142.566 kg (314 lb 4.8 oz)  03/23/15 137.44 kg (303 lb)      ASSESSMENT AND PLAN:  # Chronic systolic heart failure: Miguel. Bixler appears to be euvolemic on exam today.  LVEF is 40-45%.  He has a non-ischemic cardiomyopathy.  We will obtain an echo to assess his systolic function.  BP is low but he denies lower dizziness or lightheadedness.  Continue lisinopril and carvedilol.  BP is too low to add spironolactone.  Continue lasix 40 mg daily.  # Hypertension: Blood pressure is well-controlled.  It is low but he is asymptomatic.  Continue carvedilol and lisinopril.  # Hyperlipidemia: Continue lovastatin.  Managed by his PCP.  # Obesity: Miguel. Sawin has done a good job of losing weight and improving his diet.  He was congratulated on these efforts.  We discussed the importance of increasing his physical activity to 30-40 minutes most days of the week.   # OSA: CPAP is not fitting well.  This is likely due to his weight loss.  We will refer him for a titration study.   Current medicines are reviewed at length with the patient today.  The patient does not have concerns regarding medicines.  The following changes have been made:  no change  Labs/ tests ordered today include:   Orders Placed This Encounter  Procedures  . Split night study    Labs from PCP  Disposition:   FU with Sheleen Conchas C.  Duke Salvia, MD in 6 months.    Signed, Chilton Si, MD  04/08/2016 8:49 PM    [facility]

## 2016-04-07 ENCOUNTER — Encounter: Payer: Self-pay | Admitting: Cardiovascular Disease

## 2016-04-07 ENCOUNTER — Ambulatory Visit (INDEPENDENT_AMBULATORY_CARE_PROVIDER_SITE_OTHER): Payer: BLUE CROSS/BLUE SHIELD | Admitting: Cardiovascular Disease

## 2016-04-07 VITALS — BP 92/70 | HR 90 | Ht 69.0 in | Wt 298.6 lb

## 2016-04-07 DIAGNOSIS — I10 Essential (primary) hypertension: Secondary | ICD-10-CM

## 2016-04-07 DIAGNOSIS — Z9989 Dependence on other enabling machines and devices: Principal | ICD-10-CM

## 2016-04-07 DIAGNOSIS — I5042 Chronic combined systolic (congestive) and diastolic (congestive) heart failure: Secondary | ICD-10-CM

## 2016-04-07 DIAGNOSIS — G4733 Obstructive sleep apnea (adult) (pediatric): Secondary | ICD-10-CM | POA: Diagnosis not present

## 2016-04-07 DIAGNOSIS — E785 Hyperlipidemia, unspecified: Secondary | ICD-10-CM

## 2016-04-07 NOTE — Patient Instructions (Addendum)
Medication Instructions:  Your physician recommends that you continue on your current medications as directed. Please refer to the Current Medication list given to you today.  Labwork: Requested from you PCP  Testing/Procedures: Your physician has recommended that you have a sleep study. This test records several body functions during sleep, including: brain activity, eye movement, oxygen and carbon dioxide blood levels, heart rate and rhythm, breathing rate and rhythm, the flow of air through your mouth and nose, snoring, body muscle movements, and chest and belly movement.  Follow-Up: Your physician wants you to follow-up in: 6 month ov You will receive a reminder letter in the mail two months in advance. If you don't receive a letter, please call our office to schedule the follow-up appointment.  If you need a refill on your cardiac medications before your next appointment, please call your pharmacy.

## 2016-04-08 ENCOUNTER — Encounter: Payer: Self-pay | Admitting: Cardiovascular Disease

## 2016-05-29 ENCOUNTER — Encounter (HOSPITAL_BASED_OUTPATIENT_CLINIC_OR_DEPARTMENT_OTHER): Payer: BLUE CROSS/BLUE SHIELD

## 2017-11-09 ENCOUNTER — Encounter (HOSPITAL_COMMUNITY): Payer: Self-pay | Admitting: *Deleted

## 2017-11-09 ENCOUNTER — Other Ambulatory Visit (HOSPITAL_COMMUNITY): Payer: PRIVATE HEALTH INSURANCE

## 2017-11-09 ENCOUNTER — Ambulatory Visit (HOSPITAL_COMMUNITY): Payer: PRIVATE HEALTH INSURANCE

## 2017-11-09 ENCOUNTER — Other Ambulatory Visit: Payer: Self-pay

## 2017-11-09 ENCOUNTER — Emergency Department (HOSPITAL_COMMUNITY): Payer: PRIVATE HEALTH INSURANCE

## 2017-11-09 ENCOUNTER — Inpatient Hospital Stay (HOSPITAL_COMMUNITY)
Admission: EM | Admit: 2017-11-09 | Discharge: 2017-11-13 | DRG: 286 | Disposition: A | Discharge status: Home / Self Care | Payer: PRIVATE HEALTH INSURANCE | Attending: Internal Medicine | Admitting: Internal Medicine

## 2017-11-09 ENCOUNTER — Inpatient Hospital Stay (HOSPITAL_COMMUNITY): Payer: PRIVATE HEALTH INSURANCE

## 2017-11-09 DIAGNOSIS — N179 Acute kidney failure, unspecified: Secondary | ICD-10-CM | POA: Diagnosis present

## 2017-11-09 DIAGNOSIS — Z7982 Long term (current) use of aspirin: Secondary | ICD-10-CM

## 2017-11-09 DIAGNOSIS — J9601 Acute respiratory failure with hypoxia: Secondary | ICD-10-CM | POA: Diagnosis present

## 2017-11-09 DIAGNOSIS — E1122 Type 2 diabetes mellitus with diabetic chronic kidney disease: Secondary | ICD-10-CM | POA: Diagnosis present

## 2017-11-09 DIAGNOSIS — I10 Essential (primary) hypertension: Secondary | ICD-10-CM | POA: Diagnosis present

## 2017-11-09 DIAGNOSIS — I159 Secondary hypertension, unspecified: Secondary | ICD-10-CM | POA: Diagnosis not present

## 2017-11-09 DIAGNOSIS — Z794 Long term (current) use of insulin: Secondary | ICD-10-CM | POA: Diagnosis not present

## 2017-11-09 DIAGNOSIS — IMO0002 Reserved for concepts with insufficient information to code with codable children: Secondary | ICD-10-CM

## 2017-11-09 DIAGNOSIS — I5043 Acute on chronic combined systolic (congestive) and diastolic (congestive) heart failure: Secondary | ICD-10-CM | POA: Diagnosis present

## 2017-11-09 DIAGNOSIS — Z8669 Personal history of other diseases of the nervous system and sense organs: Secondary | ICD-10-CM | POA: Diagnosis not present

## 2017-11-09 DIAGNOSIS — M109 Gout, unspecified: Secondary | ICD-10-CM | POA: Diagnosis present

## 2017-11-09 DIAGNOSIS — Z8249 Family history of ischemic heart disease and other diseases of the circulatory system: Secondary | ICD-10-CM | POA: Diagnosis not present

## 2017-11-09 DIAGNOSIS — E1165 Type 2 diabetes mellitus with hyperglycemia: Secondary | ICD-10-CM | POA: Diagnosis present

## 2017-11-09 DIAGNOSIS — Z833 Family history of diabetes mellitus: Secondary | ICD-10-CM

## 2017-11-09 DIAGNOSIS — G4733 Obstructive sleep apnea (adult) (pediatric): Secondary | ICD-10-CM | POA: Diagnosis present

## 2017-11-09 DIAGNOSIS — Z806 Family history of leukemia: Secondary | ICD-10-CM | POA: Diagnosis not present

## 2017-11-09 DIAGNOSIS — N189 Chronic kidney disease, unspecified: Secondary | ICD-10-CM | POA: Diagnosis not present

## 2017-11-09 DIAGNOSIS — E785 Hyperlipidemia, unspecified: Secondary | ICD-10-CM | POA: Diagnosis present

## 2017-11-09 DIAGNOSIS — I130 Hypertensive heart and chronic kidney disease with heart failure and stage 1 through stage 4 chronic kidney disease, or unspecified chronic kidney disease: Secondary | ICD-10-CM | POA: Diagnosis present

## 2017-11-09 DIAGNOSIS — Z6841 Body Mass Index (BMI) 40.0 and over, adult: Secondary | ICD-10-CM

## 2017-11-09 DIAGNOSIS — N183 Chronic kidney disease, stage 3 (moderate): Secondary | ICD-10-CM | POA: Diagnosis present

## 2017-11-09 DIAGNOSIS — I428 Other cardiomyopathies: Secondary | ICD-10-CM | POA: Diagnosis present

## 2017-11-09 DIAGNOSIS — E1142 Type 2 diabetes mellitus with diabetic polyneuropathy: Secondary | ICD-10-CM | POA: Diagnosis present

## 2017-11-09 HISTORY — DX: Gout, unspecified: M10.9

## 2017-11-09 HISTORY — DX: Obstructive sleep apnea (adult) (pediatric): G47.33

## 2017-11-09 HISTORY — DX: Gastro-esophageal reflux disease without esophagitis: K21.9

## 2017-11-09 HISTORY — DX: Unspecified asthma, uncomplicated: J45.909

## 2017-11-09 HISTORY — DX: Dependence on other enabling machines and devices: Z99.89

## 2017-11-09 HISTORY — DX: Type 2 diabetes mellitus without complications: E11.9

## 2017-11-09 HISTORY — DX: Type 2 diabetes mellitus with diabetic polyneuropathy: E11.42

## 2017-11-09 LAB — GLUCOSE, CAPILLARY
Glucose-Capillary: 209 mg/dL — ABNORMAL HIGH (ref 65–99)
Glucose-Capillary: 141 mg/dL — ABNORMAL HIGH (ref 65–99)
Glucose-Capillary: 184 mg/dL — ABNORMAL HIGH (ref 65–99)

## 2017-11-09 LAB — COMPREHENSIVE METABOLIC PANEL
ALT: 34 U/L (ref 17–63)
ANION GAP: 9 (ref 5–15)
AST: 35 U/L (ref 15–41)
Albumin: 3.1 g/dL — ABNORMAL LOW (ref 3.5–5.0)
Alkaline Phosphatase: 75 U/L (ref 38–126)
BILIRUBIN TOTAL: 0.6 mg/dL (ref 0.3–1.2)
BUN: 28 mg/dL — ABNORMAL HIGH (ref 6–20)
CO2: 20 mmol/L — ABNORMAL LOW (ref 22–32)
Calcium: 8.8 mg/dL — ABNORMAL LOW (ref 8.9–10.3)
Chloride: 106 mmol/L (ref 101–111)
Creatinine, Ser: 1.64 mg/dL — ABNORMAL HIGH (ref 0.61–1.24)
GFR calc Af Amer: 52 mL/min — ABNORMAL LOW (ref >60)
GFR, EST NON AFRICAN AMERICAN: 45 mL/min — AB (ref >60)
Glucose, Bld: 313 mg/dL — ABNORMAL HIGH (ref 65–99)
POTASSIUM: 4.5 mmol/L (ref 3.5–5.1)
Sodium: 135 mmol/L (ref 135–145)
TOTAL PROTEIN: 6.6 g/dL (ref 6.5–8.1)

## 2017-11-09 LAB — I-STAT TROPONIN, ED: Troponin i, poc: 0.02 ng/mL (ref 0.00–0.08)

## 2017-11-09 LAB — CBC
HCT: 41.3 % (ref 39.0–52.0)
Hemoglobin: 13.1 g/dL (ref 13.0–17.0)
MCH: 30.6 pg (ref 26.0–34.0)
MCHC: 31.7 g/dL (ref 30.0–36.0)
MCV: 96.5 fL (ref 78.0–100.0)
PLATELETS: 186 10*3/uL (ref 150–400)
RBC: 4.28 MIL/uL (ref 4.22–5.81)
RDW: 13.9 % (ref 11.5–15.5)
WBC: 7.4 10*3/uL (ref 4.0–10.5)

## 2017-11-09 LAB — I-STAT VENOUS BLOOD GAS, ED
ACID-BASE DEFICIT: 6 mmol/L — AB (ref 0.0–2.0)
Bicarbonate: 20.5 mmol/L (ref 20.0–28.0)
O2 Saturation: 95 %
PH VEN: 7.28 (ref 7.250–7.430)
TCO2: 22 mmol/L (ref 22–32)
pCO2, Ven: 43.7 mmHg — ABNORMAL LOW (ref 44.0–60.0)
pO2, Ven: 85 mmHg — ABNORMAL HIGH (ref 32.0–45.0)

## 2017-11-09 LAB — ECHOCARDIOGRAM COMPLETE
Ao-asc: 35 cm
Area-P 1/2: 7.33 cm2
CHL CUP MV DEC (S): 102
EWDT: 102 ms
FS: 12 % — AB (ref 28–44)
Height: 69 in
IVS/LV PW RATIO, ED: 0.99
LA ID, A-P, ES: 45 mm
LA diam end sys: 45 mm
LA vol: 88.3 mL
LADIAMINDEX: 1.66 cm/m2
LAVOLA4C: 99.7 mL
LAVOLIN: 32.6 mL/m2
LDCA: 3.8 cm2
LVOTD: 22 mm
MV Peak grad: 7 mmHg
MV pk A vel: 39.4 m/s
MV pk E vel: 130 m/s
P 1/2 time: 30 ms
PW: 11.5 mm — AB (ref 0.6–1.1)
RV TAPSE: 23.6 mm
Weight: 5020.8 oz

## 2017-11-09 LAB — HEMOGLOBIN A1C
HEMOGLOBIN A1C: 7.4 % — AB (ref 4.8–5.6)
MEAN PLASMA GLUCOSE: 165.68 mg/dL

## 2017-11-09 LAB — I-STAT CG4 LACTIC ACID, ED
LACTIC ACID, VENOUS: 2.01 mmol/L — AB (ref 0.5–1.9)
Lactic Acid, Venous: 2.18 mmol/L (ref 0.5–1.9)

## 2017-11-09 LAB — CBG MONITORING, ED: Glucose-Capillary: 207 mg/dL — ABNORMAL HIGH (ref 65–99)

## 2017-11-09 LAB — HIV ANTIBODY (ROUTINE TESTING W REFLEX): HIV SCREEN 4TH GENERATION: NONREACTIVE

## 2017-11-09 LAB — LACTIC ACID, PLASMA: Lactic Acid, Venous: 2 mmol/L (ref 0.5–1.9)

## 2017-11-09 LAB — BRAIN NATRIURETIC PEPTIDE: B Natriuretic Peptide: 48.5 pg/mL (ref 0.0–100.0)

## 2017-11-09 MED ORDER — ALLOPURINOL 300 MG PO TABS
300.0000 mg | ORAL_TABLET | Freq: Every day | ORAL | Status: DC
  Administered 2017-11-09 – 2017-11-13 (×5): 300 mg via ORAL
  Filled 2017-11-09 (×5): qty 1

## 2017-11-09 MED ORDER — ASPIRIN EC 81 MG PO TBEC
81.0000 mg | DELAYED_RELEASE_TABLET | Freq: Every day | ORAL | Status: DC
  Administered 2017-11-09 – 2017-11-12 (×4): 81 mg via ORAL
  Filled 2017-11-09 (×4): qty 1

## 2017-11-09 MED ORDER — SODIUM CHLORIDE 0.9% FLUSH
3.0000 mL | Freq: Two times a day (BID) | INTRAVENOUS | Status: DC
  Administered 2017-11-09 – 2017-11-12 (×7): 3 mL via INTRAVENOUS

## 2017-11-09 MED ORDER — PRAVASTATIN SODIUM 40 MG PO TABS
40.0000 mg | ORAL_TABLET | Freq: Every day | ORAL | Status: DC
  Administered 2017-11-09 – 2017-11-12 (×4): 40 mg via ORAL
  Filled 2017-11-09 (×4): qty 1

## 2017-11-09 MED ORDER — SODIUM CHLORIDE 0.9% FLUSH
3.0000 mL | INTRAVENOUS | Status: DC | PRN
Start: 2017-11-09 — End: 2017-11-13

## 2017-11-09 MED ORDER — OMEGA-3-ACID ETHYL ESTERS 1 G PO CAPS
1.0000 g | ORAL_CAPSULE | Freq: Two times a day (BID) | ORAL | Status: DC
  Administered 2017-11-09 – 2017-11-12 (×8): 1 g via ORAL
  Filled 2017-11-09 (×9): qty 1

## 2017-11-09 MED ORDER — ACETAMINOPHEN 325 MG PO TABS: 650.0000 mg | ORAL_TABLET | ORAL | Status: DC | PRN

## 2017-11-09 MED ORDER — INSULIN ASPART 100 UNIT/ML ~~LOC~~ SOLN: 0.0000 [IU] | Freq: Every day | SUBCUTANEOUS | Status: DC

## 2017-11-09 MED ORDER — LIRAGLUTIDE 18 MG/3ML ~~LOC~~ SOPN
1.8000 mg | PEN_INJECTOR | Freq: Every day | SUBCUTANEOUS | Status: DC
  Administered 2017-11-11 – 2017-11-13 (×3): 1.8 mg via SUBCUTANEOUS
  Filled 2017-11-09 (×2): qty 3

## 2017-11-09 MED ORDER — ONDANSETRON HCL 4 MG/2ML IJ SOLN: 4.0000 mg | Freq: Four times a day (QID) | INTRAMUSCULAR | Status: DC | PRN

## 2017-11-09 MED ORDER — INSULIN ASPART 100 UNIT/ML ~~LOC~~ SOLN
0.0000 [IU] | Freq: Three times a day (TID) | SUBCUTANEOUS | Status: DC
  Administered 2017-11-09: 3 [IU] via SUBCUTANEOUS
  Administered 2017-11-09: 1 [IU] via SUBCUTANEOUS
  Administered 2017-11-09: 3 [IU] via SUBCUTANEOUS
  Administered 2017-11-10 – 2017-11-12 (×6): 2 [IU] via SUBCUTANEOUS
  Administered 2017-11-12: 1 [IU] via SUBCUTANEOUS
  Filled 2017-11-09: qty 1

## 2017-11-09 MED ORDER — POTASSIUM CHLORIDE CRYS ER 20 MEQ PO TBCR
20.0000 meq | EXTENDED_RELEASE_TABLET | Freq: Two times a day (BID) | ORAL | Status: DC
  Administered 2017-11-09 – 2017-11-13 (×9): 20 meq via ORAL
  Filled 2017-11-09 (×9): qty 1

## 2017-11-09 MED ORDER — SODIUM CHLORIDE 0.9 % IV SOLN: 250.0000 mL | INTRAVENOUS | Status: DC | PRN

## 2017-11-09 MED ORDER — GABAPENTIN 300 MG PO CAPS
300.0000 mg | ORAL_CAPSULE | Freq: Every day | ORAL | Status: DC
  Administered 2017-11-09 – 2017-11-12 (×4): 300 mg via ORAL
  Filled 2017-11-09 (×4): qty 1

## 2017-11-09 MED ORDER — NITROGLYCERIN IN D5W 200-5 MCG/ML-% IV SOLN
0.0000 ug/min | Freq: Once | INTRAVENOUS | Status: AC
  Administered 2017-11-09: 5 ug/min via INTRAVENOUS
  Filled 2017-11-09: qty 250

## 2017-11-09 MED ORDER — ENOXAPARIN SODIUM 40 MG/0.4ML ~~LOC~~ SOLN
40.0000 mg | SUBCUTANEOUS | Status: DC
  Administered 2017-11-09 – 2017-11-10 (×2): 40 mg via SUBCUTANEOUS
  Filled 2017-11-09 (×2): qty 0.4

## 2017-11-09 MED ORDER — NITROGLYCERIN 2 % TD OINT
0.5000 [in_us] | TOPICAL_OINTMENT | Freq: Four times a day (QID) | TRANSDERMAL | Status: DC
  Administered 2017-11-09 – 2017-11-13 (×15): 0.5 [in_us] via TOPICAL
  Filled 2017-11-09: qty 30

## 2017-11-09 MED ORDER — NITROGLYCERIN 2 % TD OINT
1.0000 [in_us] | TOPICAL_OINTMENT | Freq: Once | TRANSDERMAL | Status: AC
  Administered 2017-11-09: 1 [in_us] via TOPICAL

## 2017-11-09 MED ORDER — FUROSEMIDE 10 MG/ML IJ SOLN
40.0000 mg | INTRAMUSCULAR | Status: AC
  Administered 2017-11-09: 40 mg via INTRAVENOUS
  Filled 2017-11-09: qty 4

## 2017-11-09 MED ORDER — NITROGLYCERIN 2 % TD OINT
1.0000 [in_us] | TOPICAL_OINTMENT | Freq: Once | TRANSDERMAL | Status: AC
  Administered 2017-11-09: 1 [in_us] via TOPICAL
  Filled 2017-11-09: qty 1

## 2017-11-09 MED ORDER — FUROSEMIDE 10 MG/ML IJ SOLN
40.0000 mg | Freq: Two times a day (BID) | INTRAMUSCULAR | Status: DC
  Administered 2017-11-09 – 2017-11-10 (×2): 40 mg via INTRAVENOUS
  Filled 2017-11-09 (×2): qty 4

## 2017-11-09 MED ORDER — CARVEDILOL 6.25 MG PO TABS
6.2500 mg | ORAL_TABLET | Freq: Two times a day (BID) | ORAL | Status: DC
  Administered 2017-11-09 – 2017-11-13 (×9): 6.25 mg via ORAL
  Filled 2017-11-09 (×9): qty 1

## 2017-11-09 MED ORDER — INSULIN DETEMIR 100 UNIT/ML ~~LOC~~ SOLN
70.0000 [IU] | Freq: Every day | SUBCUTANEOUS | Status: DC
  Administered 2017-11-09 – 2017-11-13 (×5): 70 [IU] via SUBCUTANEOUS
  Filled 2017-11-09 (×5): qty 0.7

## 2017-11-09 NOTE — ED Notes (Signed)
The pt is sleeping 

## 2017-11-09 NOTE — ED Provider Notes (Signed)
[name] EMERGENCY DEPARTMENT Provider Note   CSN: [id number] Arrival date & time: 11/09/17  0441     History   Chief Complaint Chief Complaint  Patient presents with  . Shortness of Breath    HPI Miguel Ho is a 56 y.o. male.  56yo M w/ PMH including CHF w/ EF 40-45% in 2016, T2DM, HTN, HLD, OSA who p/w shortness of breath.  Tonight around 4:45 AM the patient woke up with sudden shortness of breath and respiratory distress.  When EMS arrived he was diaphoretic, short of breath, and hypoxic to 80% on room air.  He was started on CPAP and transported here.  He denies any chest pain, states that he was feeling okay earlier today but has noted some recent nominal and lower extremity he had he has been compliant with his medications.  His doctor took him off of one blood pressure medication recently.  He denies any cough/cold symptoms or recent illness.  LEVEL 5 CAVEAT DUE TO RESPIRATORY DISTRESS   The history is provided by the patient and the EMS personnel.  Shortness of Breath     Past Medical History:  Diagnosis Date  . Chronic combined systolic and diastolic heart failure (HCC) 10/12/2015  . Chronic systolic heart failure (HCC) 10/12/2015  . Diabetes mellitus   . Diabetes mellitus (HCC) 10/12/2015  . Essential hypertension 10/12/2015  . History of gout   . History of high cholesterol   . History of sleep apnea   . Hyperlipidemia 10/12/2015  . Hypertension   . Morbid obesity (HCC) 10/12/2015  . Numbness of toes     Patient Active Problem List   Diagnosis Date Noted  . Essential hypertension 10/12/2015  . Hyperlipidemia 10/12/2015  . Morbid obesity (HCC) 10/12/2015  . Chronic combined systolic and diastolic heart failure (HCC) 10/12/2015  . Diabetes mellitus (HCC) 10/12/2015    Past Surgical History:  Procedure Laterality Date  . CARDIAC CATHETERIZATION  2003       Home Medications    Prior to Admission medications     Medication Sig Start Date End Date Taking? Authorizing Provider  allopurinol (ZYLOPRIM) 300 MG tablet Take 300 mg by mouth daily.     Yes [provider]  aspirin EC 81 MG tablet Take 81 mg by mouth daily.     Yes [provider]  carvedilol (COREG) 6.25 MG tablet Take 6.25 mg by mouth 2 (two) times daily with a meal.     Yes [provider]  furosemide (LASIX) 40 MG tablet Take 40 mg by mouth daily.     Yes [provider]  gabapentin (NEURONTIN) 300 MG capsule Take 300 mg by mouth at bedtime. 09/14/17  Yes [provider]  LEVEMIR FLEXTOUCH 100 UNIT/ML Pen Inject 70 Units into the skin daily.  03/28/16  Yes [provider]  lovastatin (MEVACOR) 40 MG tablet Take 40 mg by mouth at bedtime.     Yes [provider]  Omega-3 Fatty Acids (FISH OIL TRIPLE STRENGTH) 1400 MG CAPS Take 1,400 mg by mouth daily.     Yes [provider]  potassium chloride (K-DUR) 10 MEQ tablet Take 10 mEq by mouth daily.    Yes [provider]  VICTOZA 18 MG/3ML SOPN Inject 1.8 mg into the skin daily. 03/21/16  Yes [provider]    Family History Family History  Problem Relation Age of Onset  . Heart attack Father  deceased age 57  . Hypertension Father   . Diabetes Father   . Heart disease Father   . Cancer Sister        leukemia  . Heart attack Mother        deceased age 57  . Heart attack Paternal Grandmother     Social History Social History   Tobacco Use  . Smoking status: Never Smoker  . Smokeless tobacco: Never Used  Substance Use Topics  . Alcohol use: Yes    Comment: occasionally  . Drug use: No     Allergies   Patient has no known allergies.   Review of Systems Review of Systems  Unable to perform ROS: Severe respiratory distress  Respiratory: Positive for shortness of breath.      Physical Exam Updated Vital Signs BP (!) 133/98   Pulse (!) 102   Temp (!) 97.4 F (36.3 C)   Resp  (!) 9   Ht 5\' 9"  (1.753 m)   Wt 136.1 kg (300 lb)   SpO2 93%   BMI 44.30 kg/m   Physical Exam  Constitutional: He is oriented to person, place, and time. He appears well-developed and well-nourished. He appears distressed.  HENT:  Head: Normocephalic and atraumatic.  Moist mucous membranes  Eyes: Conjunctivae are normal.  Neck: Neck supple.  Cardiovascular: Regular rhythm and normal heart sounds. Tachycardia present.  No murmur heard. Pulmonary/Chest: Accessory muscle usage present. Tachypnea noted. He is in respiratory distress. He has rales (all lung fields).  On bipap  Abdominal: Soft. Bowel sounds are normal. He exhibits distension. There is no tenderness.  Musculoskeletal:       Right lower leg: He exhibits edema.       Left lower leg: He exhibits edema.  Neurological: He is alert and oriented to person, place, and time.  Fluent speech  Skin: Skin is warm. He is diaphoretic.  Psychiatric: Judgment normal. His mood appears anxious.  Nursing note and vitals reviewed.    ED Treatments / Results  Labs (all labs ordered are listed, but only abnormal results are displayed) Labs Reviewed  COMPREHENSIVE METABOLIC PANEL - Abnormal; Notable for the following components:      Result Value   CO2 20 (*)    Glucose, Bld 313 (*)    BUN 28 (*)    Creatinine, Ser 1.64 (*)    Calcium 8.8 (*)    Albumin 3.1 (*)    GFR calc non Af Amer 45 (*)    GFR calc Af Amer 52 (*)    All other components within normal limits  I-STAT CG4 LACTIC ACID, ED - Abnormal; Notable for the following components:   Lactic Acid, Venous 2.18 (*)    All other components within normal limits  I-STAT VENOUS BLOOD GAS, ED - Abnormal; Notable for the following components:   pCO2, Ven 43.7 (*)    pO2, Ven 85.0 (*)    Acid-base deficit 6.0 (*)    All other components within normal limits  BRAIN NATRIURETIC PEPTIDE  CBC  HEMOGLOBIN A1C  I-STAT TROPONIN, ED  I-STAT CG4 LACTIC ACID, ED    EKG  EKG  Interpretation  Date/Time:  Thursday November 09 2017 04:47:06 EST Ventricular Rate:  112 PR Interval:    QRS Duration: 90 QT Interval:  348 QTC Calculation: 475 R Axis:   86 Text Interpretation:  Sinus tachycardia Borderline T abnormalities, inferior leads No significant change since last tracing Confirmed by Frederick PeersLittle, Katora Fini [phone]([id number]) on 11/09/2017 5:01:47 AM  Also confirmed by Frederick Peers [id number]), editor Elita Quick [phone])  on 11/09/2017 6:37:25 AM       Radiology Dg Chest Port 1 View  Result Date: 11/09/2017 CLINICAL DATA:  Acute onset of shortness of breath and cough. Decreased O2 saturation. Diaphoresis. EXAM: PORTABLE CHEST 1 VIEW COMPARISON:  Chest radiograph performed 03/02/2011 FINDINGS: The lungs are well-aerated. Vascular congestion is noted. Bilateral central airspace opacities raise concern for pulmonary edema. There is no evidence of pleural effusion or pneumothorax. The cardiomediastinal silhouette is within normal limits. No acute osseous abnormalities are seen. IMPRESSION: Vascular congestion. Bilateral central airspace opacities raise concern for pulmonary edema. Electronically Signed   By: Roanna Raider M.D.   On: 11/09/2017 05:03    Procedures .Critical Care Performed by: Laurence Spates, MD Authorized by: Laurence Spates, MD   Critical care provider statement:    Critical care time (minutes):  30   Critical care time was exclusive of:  Separately billable procedures and treating other patients   Critical care was necessary to treat or prevent imminent or life-threatening deterioration of the following conditions:  Cardiac failure and respiratory failure   Critical care was time spent personally by me on the following activities:  Development of treatment plan with patient or surrogate, evaluation of patient's response to treatment, examination of patient, obtaining history from patient or surrogate, ordering and performing treatments and  interventions, ordering and review of laboratory studies, ordering and review of radiographic studies, re-evaluation of patient's condition and review of old charts   (including critical care time)  Medications Ordered in ED Medications  furosemide (LASIX) injection 40 mg (not administered)  insulin aspart (novoLOG) injection 0-9 Units (not administered)  insulin aspart (novoLOG) injection 0-5 Units (not administered)  nitroGLYCERIN (NITROGLYN) 2 % ointment 1 inch (1 inch Topical Given 11/09/17 0504)  nitroGLYCERIN 50 mg in dextrose 5 % 250 mL (0.2 mg/mL) infusion (0 mcg/min Intravenous Stopped 11/09/17 0712)  furosemide (LASIX) injection 40 mg (40 mg Intravenous Given 11/09/17 0509)     Initial Impression / Assessment and Plan / ED Course  I have reviewed the triage vital signs and the nursing notes.  Pertinent labs & imaging results that were available during my care of the patient were reviewed by me and considered in my medical decision making (see chart for details).     PT w/ sudden SOB, hypoxia, on bipap on arrival.  In mild respiratory distress but with normal O2 saturation on BiPAP.  No chest pain.  Evidence of volume overload with lower extremity edema and abdominal distention.  Gave IV lasix, started NTG drip.  VBG and troponin reassuring, creatinine is elevated from baseline, 1.64 today.  Chest x-ray shows pulmonary edema and his story is consistent with CHF exacerbation.  After 2 hours on BiPAP, his work of breathing significantly improved and he was comfortable on reassessment.  Switched to nasal cannula and stop nitroglycerin drip, switched to Nitropaste.  Discussed admission with hospitalist, Revonda Standard, and patient admitted for diuresis and further workup.  Final Clinical Impressions(s) / ED Diagnoses   Final diagnoses:  None    ED Discharge Orders    None       Kinzlie Harney, Ambrose Finland, MD 11/09/17 [id number]

## 2017-11-09 NOTE — Progress Notes (Signed)
  On call notified of lactic acid of 2.0. No new orders at this time

## 2017-11-09 NOTE — ED Notes (Signed)
Dr little took the pt off bi-pap and placed on nasal 02 for  A trial  Nitro still at 18mcg/min

## 2017-11-09 NOTE — ED Notes (Signed)
bp lower  Nitro drip at orifinal start it has not been increased

## 2017-11-09 NOTE — ED Notes (Signed)
The pt breathing  He is better  His urine is more dilute

## 2017-11-09 NOTE — ED Notes (Signed)
NITRO PASTE RFEMOVED IV NITRO BEGINNING

## 2017-11-09 NOTE — ED Notes (Signed)
Nitro drip stopped nitro paste 1 "  Lt upper chest  Pt voided another  charted

## 2017-11-09 NOTE — Progress Notes (Signed)
  Echocardiogram 2D Echocardiogram has been performed.  Evens Meno G Kyonna Frier 11/09/2017, 5:01 PM

## 2017-11-09 NOTE — ED Notes (Signed)
Dr. Caleb Popp notified that lactic resulted at 2.01

## 2017-11-09 NOTE — H&P (Signed)
History and Physical    Miguel Ho IWL:[id number] DOB: [birth year] DOA: 11/09/2017   PCP: Ralene Ok, MD   Attending physician: Caleb Popp  Patient coming from/Resides with: Private residence  Chief Complaint: Shortness of breath  HPI: Miguel Ho is a 56 y.o. male with medical history significant for obesity, OSA on CPAP, hypertension, gout, known chronic combined systolic and diastolic heart failure, dyslipidemia and diabetes on insulin.  Patient was in the usual state of health until he awakened early this morning with significant shortness of breath and wheezing.  He wore his CPAP to bed per his usual routine.  His dyspnea was marked enough that he was unable to make it to the car to try to come to the ER so EMS was called.  Upon EMS arrival the patient's O2 saturations were noted to be 80%.  Upon arrival to the ER he was tachycardic, hypertensive and hypoxemic.  Chest x-ray revealed vascular congestion consistent with heart failure exacerbation.  He was initially given Nitropaste but given his acute respiratory symptoms this was changed to a nitro drip at 5 mcg/min.  He was placed on BiPAP with 40% FiO2.  He was given Lasix 40 mg IV x1.  Since that time patient's respiratory status has stabilized.  His blood pressure has decreased to normal ranges.  He has diuresed greater than 1 L.  And he has been transitioned from BiPAP to 3 L of oxygen.  Patient will be admitted to telemetry with a diagnosis of acute on chronic systolic and diastolic heart failure.  Note BNP was not elevated.  ED Course:  Vital Signs: BP (!) 134/97   Pulse (!) 102   Temp (!) 97.4 F (36.3 C)   Resp 18   Ht 5\' 9"  (1.753 m)   Wt 136.1 kg (300 lb)   SpO2 95%   BMI 44.30 kg/m  PCXR: Vascular congestion with bilateral central airspace opacities concerning for pulmonary edema Lab data: Sodium 135, potassium 4.5, chloride 106, CO2 20, glucose 313, BUN 28, creatinine 1.64, calcium 8.8, anion gap 9, GFR  52, BNP 48.5, POC troponin 0 0.02, lactic acid 2.18 with repeat 2.01 context of preadmission hypoxemia, white count 7400 differential not obtained, hemoglobin 13.1, platelets 186,000, hemoglobin A1c 7.4 Medications and treatments: Nitroglycerin paste 1 inch x1, nitroglycerin infusion 57mcg/min now discontinued, Lasix 40 mg IV x1  Review of Systems:  In addition to the HPI above,  No Fever-chills, myalgias or other constitutional symptoms No Headache, changes with Vision or hearing, new weakness, tingling, numbness in any extremity, dizziness, dysarthria or word finding difficulty, gait disturbance or imbalance, tremors or seizure activity No problems swallowing food or Liquids, patient occasionally reports feeling "gassy," as well as belches a foul smell, NO choking or coughing while eating, abdominal pain with or after eating No Chest pain, Cough, palpitations No Abdominal pain, N/V, melena,hematochezia, dark tarry stools, constipation No dysuria, malodorous urine, hematuria or flank pain No new skin rashes, lesions, masses or bruises, No new joint pains, aches, swelling or redness No recent unintentional weight loss No polyuria, polydypsia or polyphagia   Past Medical History:  Diagnosis Date  . Chronic combined systolic and diastolic heart failure (HCC) 10/12/2015  . Chronic systolic heart failure (HCC) 10/12/2015  . Diabetes mellitus   . Diabetes mellitus (HCC) 10/12/2015  . Essential hypertension 10/12/2015  . History of gout   . History of high cholesterol   . History of sleep apnea   . Hyperlipidemia 10/12/2015  .  Hypertension   . Morbid obesity (HCC) 10/12/2015  . Numbness of toes     Past Surgical History:  Procedure Laterality Date  . CARDIAC CATHETERIZATION  2003    Social History   Socioeconomic History  . Marital status: Married    Spouse name: Not on file  . Number of children: Not on file  . Years of education: Not on file  . Highest education level: Not on  file  Social Needs  . Financial resource strain: Not on file  . Food insecurity - worry: Not on file  . Food insecurity - inability: Not on file  . Transportation needs - medical: Not on file  . Transportation needs - non-medical: Not on file  Occupational History  . Not on file  Tobacco Use  . Smoking status: Never Smoker  . Smokeless tobacco: Never Used  Substance and Sexual Activity  . Alcohol use: Yes    Comment: occasionally  . Drug use: No  . Sexual activity: Not on file  Other Topics Concern  . Not on file  Social History Narrative   Epworth scale per 10/12/15 is a 3    Mobility: Independent Work history: Not obtained   No Known Allergies  Family History  Problem Relation Age of Onset  . Heart attack Father        deceased age 683  . Hypertension Father   . Diabetes Father   . Heart disease Father   . Cancer Sister        leukemia  . Heart attack Mother        deceased age 57  . Heart attack Paternal Grandmother      Prior to Admission medications   Medication Sig Start Date End Date Taking? Authorizing Provider  allopurinol (ZYLOPRIM) 300 MG tablet Take 300 mg by mouth daily.     Yes [provider]  aspirin EC 81 MG tablet Take 81 mg by mouth daily.     Yes [provider]  carvedilol (COREG) 6.25 MG tablet Take 6.25 mg by mouth 2 (two) times daily with a meal.     Yes [provider]  furosemide (LASIX) 40 MG tablet Take 40 mg by mouth daily.     Yes [provider]  gabapentin (NEURONTIN) 300 MG capsule Take 300 mg by mouth at bedtime. 09/14/17  Yes [provider]  LEVEMIR FLEXTOUCH 100 UNIT/ML Pen Inject 70 Units into the skin daily.  03/28/16  Yes [provider]  lovastatin (MEVACOR) 40 MG tablet Take 40 mg by mouth at bedtime.     Yes [provider]  Omega-3 Fatty Acids (FISH OIL TRIPLE STRENGTH) 1400 MG CAPS Take 1,400 mg by mouth daily.     Yes [provider]  potassium  chloride (K-DUR) 10 MEQ tablet Take 10 mEq by mouth daily.    Yes [provider]  VICTOZA 18 MG/3ML SOPN Inject 1.8 mg into the skin daily. 03/21/16  Yes [provider]    Physical Exam: Vitals:   11/09/17 0615 11/09/17 0630 11/09/17 0700 11/09/17 0715  BP: (!) 147/102 (!) 130/96 (!) 133/98 (!) 134/97  Pulse: 98 96 (!) 102 (!) 102  Resp: 12 (!) 9  18  Temp:      SpO2: 93% 96% 93% 95%  Weight:      Height:          Constitutional: NAD, calm, comfortable Eyes: PERRL, lids and conjunctivae normal ENMT: Mucous membranes are moist. Posterior pharynx  clear of any exudate or lesions.Normal dentition.  Neck: normal, supple, no masses, no thyromegaly Respiratory: clear to auscultation bilaterally, no wheezing, does have faint bibasilar crackles normal respiratory effort out accessory muscle use at rest.  3 L Cardiovascular: Regular rate and rhythm, no murmurs / rubs / gallops.  1-2+ bilateral lower extremity edema. 2+ pedal pulses. No carotid bruits.  Abdomen: no tenderness, no masses palpated. No hepatosplenomegaly. Bowel sounds positive.  Mild abdominal distention without pitting edema. Musculoskeletal: no clubbing / cyanosis. No joint deformity upper and lower extremities. Good ROM, no contractures. Normal muscle tone.  Skin: no rashes, lesions, ulcers. No induration Neurologic: CN 2-12 grossly intact. Sensation intact, DTR normal. Strength 5/5 x all 4 extremities.  Psychiatric: Normal judgment and insight. Alert and oriented x 3. Normal mood.    Labs on Admission: I have personally reviewed following labs and imaging studies  CBC: Recent Labs  Lab 11/09/17 0454  WBC 7.4  HGB 13.1  HCT 41.3  MCV 96.5  PLT 186   Basic Metabolic Panel: Recent Labs  Lab 11/09/17 0454  NA 135  K 4.5  CL 106  CO2 20*  GLUCOSE 313*  BUN 28*  CREATININE 1.64*  CALCIUM 8.8*   GFR: Estimated Creatinine Clearance: 68.1 mL/min (A) (by C-G formula based on SCr of 1.64 mg/dL  (H)). Liver Function Tests: Recent Labs  Lab 11/09/17 0454  AST 35  ALT 34  ALKPHOS 75  BILITOT 0.6  PROT 6.6  ALBUMIN 3.1*   No results for input(s): LIPASE, AMYLASE in the last 168 hours. No results for input(s): AMMONIA in the last 168 hours. Coagulation Profile: No results for input(s): INR, PROTIME in the last 168 hours. Cardiac Enzymes: No results for input(s): CKTOTAL, CKMB, CKMBINDEX, TROPONINI in the last 168 hours. BNP (last 3 results) No results for input(s): PROBNP in the last 8760 hours. HbA1C: No results for input(s): HGBA1C in the last 72 hours. CBG: No results for input(s): GLUCAP in the last 168 hours. Lipid Profile: No results for input(s): CHOL, HDL, LDLCALC, TRIG, CHOLHDL, LDLDIRECT in the last 72 hours. Thyroid Function Tests: No results for input(s): TSH, T4TOTAL, FREET4, T3FREE, THYROIDAB in the last 72 hours. Anemia Panel: No results for input(s): VITAMINB12, FOLATE, FERRITIN, TIBC, IRON, RETICCTPCT in the last 72 hours. Urine analysis: No results found for: COLORURINE, APPEARANCEUR, LABSPEC, PHURINE, GLUCOSEU, HGBUR, BILIRUBINUR, KETONESUR, PROTEINUR, UROBILINOGEN, NITRITE, LEUKOCYTESUR Sepsis Labs: @LABRCNTIP (procalcitonin:4,lacticidven:4) )No results found for this or any previous visit (from the past 240 hour(s)).   Radiological Exams on Admission: Dg Chest Port 1 View  Result Date: 11/09/2017 CLINICAL DATA:  Acute onset of shortness of breath and cough. Decreased O2 saturation. Diaphoresis. EXAM: PORTABLE CHEST 1 VIEW COMPARISON:  Chest radiograph performed 03/02/2011 FINDINGS: The lungs are well-aerated. Vascular congestion is noted. Bilateral central airspace opacities raise concern for pulmonary edema. There is no evidence of pleural effusion or pneumothorax. The cardiomediastinal silhouette is within normal limits. No acute osseous abnormalities are seen. IMPRESSION: Vascular congestion. Bilateral central airspace opacities raise concern for  pulmonary edema. Electronically Signed   By: Roanna Raider M.D.   On: 11/09/2017 05:03    EKG: (Independently reviewed) sinus tachycardia with ventricular rate 112 bpm, QTC 475 ms, normal R wave rotation, nonspecific abnormal repolarization in the inferior leads, no acute ischemic changes  Assessment/Plan Principal Problem:   Acute on chronic combined systolic and diastolic heart failure  -Patient presents with abrupt nocturnal awakening secondary to shortness of breath and documented hypoxemia with chest  x-ray consistent with pulmonary edema.  Echocardiogram from 2016 documents grade 1 diastolic dysfunction as well as mild systolic dysfunction with an EF of 40-45%. -Repeat echocardiogram this admission -Continue carvedilol -Patient reports PCP discontinued lisinopril 3 months prior due to issues with relative hypotension -Lasix 40 mg IV every 12 hours x3 more doses with potassium supplementation -Daily weights, strict I/O -Continue NTG paste for now-if blood pressure can support may benefit from low-dose hydralazine -In review of cardiology outpatient documentation in 2017 patient was not started on spironolactone due to low blood pressure issues -PCP has been managing patient's heart failure symptoms but recommend patient follow-up with cardiologist after discharge; patient reports normal cardiac catheterization about 16 years ago and is now reporting decreased exercise tolerance and increasing fatigue and may require an outpatient ischemic evaluation -Patient has never required hospitalization for heart failure exacerbation  Active Problems:   Acute kidney injury  -Secondary to acute heart failure physiology/hypoxemia -Treat underlying causes -Follow labs    Acute respiratory failure with hypoxia  -Secondary to acute heart failure exacerbation -Treat underlying causes -Continue supportive care with oxygen; remained stable off BiPAP since weaned    Insulin dependent type 2 diabetes  mellitus, uncontrolled  -Presenting glucose 313 in the context of acute physiologic stress/increased cortisol secretion -HgbA1c  7.4 -Continue Lantus and Victoza -CBGs ac/hs -SSI    Hypertension -Continue beta-blocker and diuretic as above    History of sleep apnea -Continue nocturnal CPAP    HLD (hyperlipidemia) -Continue therapeutic substitution for Mevacor, continue omega-3 fatty acids      DVT prophylaxis: Lovenox Code Status: Full Family Communication: No family at bedside Disposition Plan: Home Consults called: None    ELLIS,ALLISON L. ANP-BC [facility] Pager [phone]   If 7PM-7AM, please contact night-coverage [email or web address] Password [facility]  11/09/2017, 7:42 AM

## 2017-11-09 NOTE — ED Triage Notes (Signed)
The pt arrived by gems from home he arrived on c-pap  .  The pt woke up at 0442 on the way to the br  Sob coughing  He was unable to get into the car to be driven to the hosp.  Initial pulse ox 80% INITIALLY THE PT WAS VERY DIAPHORECTIC   ON ARRIVAL THE PT IS LESS SOB HIS SKIN HAS DRIED UP.  TALKING WIITHOUT STOPPING FOR AIR.  NO IV

## 2017-11-10 ENCOUNTER — Encounter (HOSPITAL_COMMUNITY): Payer: Self-pay | Admitting: Cardiology

## 2017-11-10 DIAGNOSIS — I428 Other cardiomyopathies: Secondary | ICD-10-CM

## 2017-11-10 DIAGNOSIS — N189 Chronic kidney disease, unspecified: Secondary | ICD-10-CM

## 2017-11-10 DIAGNOSIS — Z794 Long term (current) use of insulin: Secondary | ICD-10-CM

## 2017-11-10 DIAGNOSIS — I5043 Acute on chronic combined systolic (congestive) and diastolic (congestive) heart failure: Secondary | ICD-10-CM

## 2017-11-10 DIAGNOSIS — E1165 Type 2 diabetes mellitus with hyperglycemia: Secondary | ICD-10-CM

## 2017-11-10 DIAGNOSIS — Z8669 Personal history of other diseases of the nervous system and sense organs: Secondary | ICD-10-CM

## 2017-11-10 LAB — BASIC METABOLIC PANEL
Anion gap: 8 (ref 5–15)
BUN: 25 mg/dL — AB (ref 6–20)
CO2: 25 mmol/L (ref 22–32)
Calcium: 9.3 mg/dL (ref 8.9–10.3)
Chloride: 102 mmol/L (ref 101–111)
Creatinine, Ser: 1.52 mg/dL — ABNORMAL HIGH (ref 0.61–1.24)
GFR calc Af Amer: 57 mL/min — ABNORMAL LOW (ref >60)
GFR, EST NON AFRICAN AMERICAN: 49 mL/min — AB (ref >60)
GLUCOSE: 189 mg/dL — AB (ref 65–99)
POTASSIUM: 4.1 mmol/L (ref 3.5–5.1)
Sodium: 135 mmol/L (ref 135–145)

## 2017-11-10 LAB — GLUCOSE, CAPILLARY
GLUCOSE-CAPILLARY: 182 mg/dL — AB (ref 65–99)
GLUCOSE-CAPILLARY: 191 mg/dL — AB (ref 65–99)
GLUCOSE-CAPILLARY: 168 mg/dL — AB (ref 65–99)
Glucose-Capillary: 170 mg/dL — ABNORMAL HIGH (ref 65–99)

## 2017-11-10 MED ORDER — FUROSEMIDE 10 MG/ML IJ SOLN
40.0000 mg | Freq: Two times a day (BID) | INTRAMUSCULAR | Status: DC
  Administered 2017-11-10 – 2017-11-11 (×2): 40 mg via INTRAVENOUS
  Filled 2017-11-10 (×2): qty 4

## 2017-11-10 MED ORDER — SACUBITRIL-VALSARTAN 24-26 MG PO TABS
1.0000 | ORAL_TABLET | Freq: Two times a day (BID) | ORAL | Status: DC
  Administered 2017-11-10 – 2017-11-11 (×3): 1 via ORAL
  Filled 2017-11-10 (×3): qty 1

## 2017-11-10 NOTE — Progress Notes (Signed)
Patient ID: Miguel Ho, male   DOB: 1961-10-17, 56 y.o.   MRN: [id number]                                                                PROGRESS NOTE                                                                                                                                                                                                             Patient Demographics:    Miguel Ho, is a 56 y.o. male, DOB - 10/04/1961, NWG:[id number]  Admit date - 11/09/2017   Admitting Physician Miguel Peace, MD  Outpatient Primary MD for the patient is Miguel Ok, MD  LOS - 1  Outpatient Specialists:     Chief Complaint  Patient presents with  . Shortness of Breath       Brief Narrative  56 y.o. male with medical history significant for obesity, OSA on CPAP, hypertension, gout, known chronic combined systolic and diastolic heart failure, dyslipidemia and diabetes on insulin.  Patient was in the usual state of health until he awakened early this morning with significant shortness of breath and wheezing.  He wore his CPAP to bed per his usual routine.  His dyspnea was marked enough that he was unable to make it to the car to try to come to the ER so EMS was called.  Upon EMS arrival the patient's O2 saturations were noted to be 80%.  Upon arrival to the ER he was tachycardic, hypertensive and hypoxemic.  Chest x-ray revealed vascular congestion consistent with heart failure exacerbation.  He was initially given Nitropaste but given his acute respiratory symptoms this was changed to a nitro drip at 5 mcg/min.  He was placed on BiPAP with 40% FiO2.  He was given Lasix 40 mg IV x1.  Since that time patient's respiratory status has stabilized.  His blood pressure has decreased to normal ranges.  He has diuresed greater than 1 L.  And he has been transitioned from BiPAP to 3 L of oxygen.  Patient will be admitted to telemetry with a diagnosis of acute on chronic systolic and diastolic heart  failure.  Note BNP was not elevated.  ED Course:  Vital Signs: BP (!) 134/97   Pulse (!) 102  Temp (!) 97.4 F (36.3 C)   Resp 18   Ht 5\' 9"  (1.753 m)   Wt 136.1 kg (300 lb)   SpO2 95%   BMI 44.30 kg/m  PCXR: Vascular congestion with bilateral central airspace opacities concerning for pulmonary edema Lab data: Sodium 135, potassium 4.5, chloride 106, CO2 20, glucose 313, BUN 28, creatinine 1.64, calcium 8.8, anion gap 9, GFR 52, BNP 48.5, POC troponin 0 0.02, lactic acid 2.18 with repeat 2.01 context of preadmission hypoxemia, white count 7400 differential not obtained, hemoglobin 13.1, platelets 186,000, hemoglobin A1c 7.4 Medications and treatments: Nitroglycerin paste 1 inch x1, nitroglycerin infusion 1mcg/min now discontinued, Lasix 40 mg IV x1     Subjective:    Miguel Ho today states that his breathing is doing well.  Denies fever, chills, cough, cp, palp, orthpnea, pnd, lower ext edema. Pt has seen Miguel Ho in the past.  EF now lower 15%,  Pt did mention vague gerd? Prior to admission but none presently.    Assessment  & Plan :    Principal Problem:   Acute on chronic combined systolic and diastolic heart failure (HCC) Active Problems:   Acute kidney injury (HCC)   Insulin dependent type 2 diabetes mellitus, uncontrolled (HCC)   Hypertension   History of sleep apnea   HLD (hyperlipidemia)   Acute respiratory failure with hypoxia (HCC)   Acute on chronic combined systolic and diastolic heart failure  -Patient presents with abrupt nocturnal awakening secondary to shortness of breath and documented hypoxemia with chest x-ray consistent with pulmonary edema.  Echocardiogram from 2016 documents grade 1 diastolic dysfunction as well as mild systolic dysfunction with an EF of 40-45%. Continue carvedilol Cont Lasix 40 mg IV every 12 hours x3 more doses with potassium supplementation Continue NTG paste for now Review of cardiology outpatient documentation in 2017  patient was not started on spironolactone due to low blood pressure issues Prior normal cath Patient has never required hospitalization for heart failure exacerbation Cardiac echo 11/09/2017= EF 15%, try on entresto. Cont iv lasix today,   Case d/w Miguel Ho for consultation regarding low EF   Acute kidney injury   Secondary to acute heart failure physiology/hypoxemia Check cmp in am  Acute respiratory failure with hypoxia  Secondary to acute heart failure exacerbation ? Continue supportive care with oxygen; remained stable off BiPAP since weaned  Insulin dependent type 2 diabetes mellitus, uncontrolled  Presenting glucose 313 in the context of acute physiologic stress/increased cortisol secretion HgbA1c  7.4 Continue Lantus and Victoza CBGs ac/hs SSI  Hypertension Continue beta-blocker and diuretic as above, Add entresto  History of sleep apnea Continue nocturnal CPAP  HLD (hyperlipidemia) Continue therapeutic substitution for Mevacor, continue omega-3 fatty acids      DVT prophylaxis: Lovenox Code Status: Full Family Communication: No family at bedside Disposition Plan: Home Consults called: None      Lab Results  Component Value Date   PLT 186 11/09/2017    Anti-infectives (From admission, onward)   None        Objective:   Vitals:   11/09/17 0900 11/09/17 1009 11/09/17 2047 11/10/17 0635  BP: (!) 133/92 (!) 153/99 123/75 134/87  Pulse: (!) 109 (!) 104 (!) 103 93  Resp: (!) 21 18 16 18   Temp:  98.5 F (36.9 C) 97.8 F (36.6 C) 97.6 F (36.4 C)  TempSrc:  Oral Oral Oral  SpO2: 93% 100% 98% 98%  Weight:  (!) 142.3 kg (313 lb 12.8 oz)  Marland Kitchen)  139.4 kg (307 lb 4.8 oz)  Height:  5\' 9"  (1.753 m)      Wt Readings from Last 3 Encounters:  11/10/17 (!) 139.4 kg (307 lb 4.8 oz)  04/07/16 135.4 kg (298 lb 9.6 oz)  10/12/15 (!) 142.6 kg (314 lb 4.8 oz)     Intake/Output Summary (Last 24 hours) at 11/10/2017 0706 Last data filed at 11/10/2017  0100 Gross per 24 hour  Intake 840 ml  Output 2600 ml  Net -1760 ml     Physical Exam  Awake Alert, Oriented X 3, No new F.N deficits, Normal affect [place].AT,PERRAL Supple Neck,No JVD, No cervical lymphadenopathy appriciated.  Symmetrical Chest wall movement, Good air movement bilaterally, CTAB RRR,s1, s2,  No Parasternal Heave +ve B.Sounds, Abd Soft, No tenderness, No organomegaly appriciated, No rebound - guarding or rigidity. No Cyanosis, Clubbing or edema, No new Rash or bruise     Data Review:    CBC Recent Labs  Lab 11/09/17 0454  WBC 7.4  HGB 13.1  HCT 41.3  PLT 186  MCV 96.5  MCH 30.6  MCHC 31.7  RDW 13.9    Chemistries  Recent Labs  Lab 11/09/17 0454  NA 135  K 4.5  CL 106  CO2 20*  GLUCOSE 313*  BUN 28*  CREATININE 1.64*  CALCIUM 8.8*  AST 35  ALT 34  ALKPHOS 75  BILITOT 0.6   ------------------------------------------------------------------------------------------------------------------ No results for input(s): CHOL, HDL, LDLCALC, TRIG, CHOLHDL, LDLDIRECT in the last 72 hours.  Lab Results  Component Value Date   HGBA1C 7.4 (H) 11/09/2017   ------------------------------------------------------------------------------------------------------------------ No results for input(s): TSH, T4TOTAL, T3FREE, THYROIDAB in the last 72 hours.  Invalid input(s): FREET3 ------------------------------------------------------------------------------------------------------------------ No results for input(s): VITAMINB12, FOLATE, FERRITIN, TIBC, IRON, RETICCTPCT in the last 72 hours.  Coagulation profile No results for input(s): INR, PROTIME in the last 168 hours.  No results for input(s): DDIMER in the last 72 hours.  Cardiac Enzymes No results for input(s): CKMB, TROPONINI, MYOGLOBIN in the last 168 hours.  Invalid input(s): CK ------------------------------------------------------------------------------------------------------------------      Component Value Date/Time   BNP 48.5 11/09/2017 0454    Inpatient Medications  Scheduled Meds: . allopurinol  300 mg Oral Daily  . aspirin EC  81 mg Oral Daily  . carvedilol  6.25 mg Oral BID WC  . enoxaparin (LOVENOX) injection  40 mg Subcutaneous Q24H  . furosemide  40 mg Intravenous Q12H  . gabapentin  300 mg Oral QHS  . insulin aspart  0-5 Units Subcutaneous QHS  . insulin aspart  0-9 Units Subcutaneous TID WC  . insulin detemir  70 Units Subcutaneous Daily  . liraglutide  1.8 mg Subcutaneous Daily  . nitroGLYCERIN  0.5 inch Topical Q6H  . omega-3 acid ethyl esters  1 g Oral BID  . potassium chloride  20 mEq Oral BID  . pravastatin  40 mg Oral q1800  . sodium chloride flush  3 mL Intravenous Q12H   Continuous Infusions: . sodium chloride     PRN Meds:.sodium chloride, acetaminophen, ondansetron (ZOFRAN) IV, sodium chloride flush  Micro Results No results found for this or any previous visit (from the past 240 hour(s)).  Radiology Reports Dg Chest Port 1 View  Result Date: 11/09/2017 CLINICAL DATA:  Acute onset of shortness of breath and cough. Decreased O2 saturation. Diaphoresis. EXAM: PORTABLE CHEST 1 VIEW COMPARISON:  Chest radiograph performed 03/02/2011 FINDINGS: The lungs are well-aerated. Vascular congestion is noted. Bilateral central airspace opacities raise concern for pulmonary edema.  There is no evidence of pleural effusion or pneumothorax. The cardiomediastinal silhouette is within normal limits. No acute osseous abnormalities are seen. IMPRESSION: Vascular congestion. Bilateral central airspace opacities raise concern for pulmonary edema. Electronically Signed   By: Roanna RaiderJeffery  Chang M.D.   On: 11/09/2017 05:03    Time Spent in minutes  30   Miguel Ho M.D on 11/10/2017 at 7:06 AM  Between 7am to 7pm - Pager - [phone]  After 7pm go to [email or web address] - password [email or web address]  [facility] -  Office  [phone]

## 2017-11-10 NOTE — Progress Notes (Signed)
Cardiology Consultation:   Patient ID: Miguel Ho; [id number]; 05-Apr-1961   Admit date: 11/09/2017 Date of Consult: 11/10/2017  Primary Care Provider: Ralene Ok, MD Primary Cardiologist: Dr. Lavell Islam Primary Electrophysiologist: NA  Patient Profile:   Miguel Ho is a 56 y.o. male with a hx of chronic combined systolic and diastolic heart failure ( EF down to 15-20% from 40-45% 2016) per echo 11/09/17, OSA on CPAP, HTN, DM II on insulin, obesity, dyslipidemia, and gout who is being seen today for the evaluation of acute on chronic systolic and diastolic heart failure at the request of Dr. Selena Batten.  History of Present Illness:   Miguel Ho is a pleasant 56yo M with a known hx of combined chronic systolic and diastolic heart failure with per echo in 2016 with an EF of 40-45% who presented to the ED after an acute worsening early Thursday AM (11/09/17) of dyspnea, orthopnea, and wheezing that woke him from his sleep. Until this episode, he states that he was in his usual state of health, however has noticed more fatigue and some mild LE swelling over the last several months. He denies any chest pain, palpitations, recent sicknesses, N/V, or fevers. He does admit to diet non-compliance over the holidays with foods high in salt which may have exacerbated his situation.   Upon admission, his O2 saturations were noted to be in the 80's, he was tachycardic, hypertensive, and hypoxic. A CXR was performed which revealed vascular congestion, bilateral airspace opacities, and pulmonary edema. There was no pneumo or pleural effusion noted. He was placed on a NTG gtt, placed on Bipap, and given one dose of IV Lasix. He stabilized and diuresed greater than 1L of fluid. The Bipap was transitioned to [place] and the NTG gtt was discontinued. No cardiac markers were drawn. His BNP was WNL.   An echo was performed yesterday, 11/09/17, which showed a decreased EF of 15-20%.   Of note, he is a  patient of Dr. Leonides Sake and was last seen by her in the office 04/05/2016. Previous echo in 2016 showed an EF of 40-45% with hypokinesis in the mid-apical anterolateral myocardium.  Past Medical History:  Diagnosis Date  . Childhood asthma   . Chronic combined systolic and diastolic heart failure (HCC) 10/12/2015  . Chronic systolic heart failure (HCC) 10/12/2015  . Diabetic peripheral neuropathy (HCC)   . Essential hypertension 10/12/2015  . GERD (gastroesophageal reflux disease)   . Gout    "on daily RX" (11/09/2017)  . Hyperlipidemia 10/12/2015  . Hypertension   . Morbid obesity (HCC) 10/12/2015  . OSA on CPAP   . Type II diabetes mellitus (HCC)     Past Surgical History:  Procedure Laterality Date  . CARDIAC CATHETERIZATION  2003  . TONSILLECTOMY       Prior to Admission medications   Medication Sig Start Date End Date Taking? Authorizing Provider  allopurinol (ZYLOPRIM) 300 MG tablet Take 300 mg by mouth daily.     Yes [provider]  aspirin EC 81 MG tablet Take 81 mg by mouth daily.     Yes [provider]  carvedilol (COREG) 6.25 MG tablet Take 6.25 mg by mouth 2 (two) times daily with a meal.     Yes [provider]  furosemide (LASIX) 40 MG tablet Take 40 mg by mouth daily.     Yes [provider]  gabapentin (NEURONTIN) 300 MG capsule Take 300 mg by mouth at bedtime. 09/14/17  Yes [provider]  LEVEMIR FLEXTOUCH 100 UNIT/ML Pen Inject 70 Units into the skin daily.  03/28/16  Yes [provider]  lovastatin (MEVACOR) 40 MG tablet Take 40 mg by mouth at bedtime.     Yes [provider]  Omega-3 Fatty Acids (FISH OIL TRIPLE STRENGTH) 1400 MG CAPS Take 1,400 mg by mouth daily.     Yes [provider]  potassium chloride (K-DUR) 10 MEQ tablet Take 10 mEq by mouth daily.    Yes [provider]  VICTOZA 18 MG/3ML SOPN Inject 1.8 mg into the skin daily. 03/21/16  Yes [provider]     Inpatient Medications: Scheduled Meds: . allopurinol  300 mg Oral Daily  . aspirin EC  81 mg Oral Daily  . carvedilol  6.25 mg Oral BID WC  . enoxaparin (LOVENOX) injection  40 mg Subcutaneous Q24H  . furosemide  40 mg Intravenous Q12H  . gabapentin  300 mg Oral QHS  . insulin aspart  0-5 Units Subcutaneous QHS  . insulin aspart  0-9 Units Subcutaneous TID WC  . insulin detemir  70 Units Subcutaneous Daily  . liraglutide  1.8 mg Subcutaneous Daily  . nitroGLYCERIN  0.5 inch Topical Q6H  . omega-3 acid ethyl esters  1 g Oral BID  . potassium chloride  20 mEq Oral BID  . pravastatin  40 mg Oral q1800  . sacubitril-valsartan  1 tablet Oral BID  . sodium chloride flush  3 mL Intravenous Q12H   Continuous Infusions: . sodium chloride     PRN Meds: sodium chloride, acetaminophen, ondansetron (ZOFRAN) IV, sodium chloride flush  Allergies:   No Known Allergies  Social History:   Social History   Socioeconomic History  . Marital status: Married    Spouse name: Not on file  . Number of children: Not on file  . Years of education: Not on file  . Highest education level: Not on file  Social Needs  . Financial resource strain: Not on file  . Food insecurity - worry: Not on file  . Food insecurity - inability: Not on file  . Transportation needs - medical: Not on file  . Transportation needs - non-medical: Not on file  Occupational History  . Not on file  Tobacco Use  . Smoking status: Never Smoker  . Smokeless tobacco: Never Used  Substance and Sexual Activity  . Alcohol use: Yes    Comment: 11/09/2017 "on major holidays I might have a drink; last drink was 02/2017"  . Drug use: No  . Sexual activity: Not on file  Other Topics Concern  . Not on file  Social History Narrative   Epworth scale per 10/12/15 is a 3    Family History:   Family History  Problem Relation Age of Onset  . Heart attack Father        deceased age 69  . Hypertension Father   . Diabetes  Father   . Heart disease Father   . Cancer Sister        leukemia  . Heart attack Mother        deceased age 57  . Heart attack Paternal Grandmother    Family Status:  Family Status  Relation Name Status  . Father  Deceased at age 42  . Sister  Alive  . Mother  Deceased at age 62  . MGM  Deceased at age UNKNOWN       UNKNOWN  . MGF  Deceased at age UNKNOWN  UNKNOWN  . PGM  Deceased at age UNKNOWN       UNKNOWN  . PGF  Deceased at age UNKNOWN       UNKNOWN    ROS:  Please see the history of present illness.  All other ROS reviewed and negative.     Physical Exam/Data:   Vitals:   11/09/17 1009 11/09/17 2047 11/10/17 0635 11/10/17 1257  BP: (!) 153/99 123/75 134/87 (!) 143/92  Pulse: (!) 104 (!) 103 93 98  Resp: 18 16 18 18   Temp: 98.5 F (36.9 C) 97.8 F (36.6 C) 97.6 F (36.4 C) 97.8 F (36.6 C)  TempSrc: Oral Oral Oral Oral  SpO2: 100% 98% 98% 96%  Weight: (!) 313 lb 12.8 oz (142.3 kg)  (!) 307 lb 4.8 oz (139.4 kg)   Height: 5\' 9"  (1.753 m)       Intake/Output Summary (Last 24 hours) at 11/10/2017 1354 Last data filed at 11/10/2017 1301 Gross per 24 hour  Intake 1080 ml  Output 4150 ml  Net -3070 ml   Filed Weights   11/09/17 0454 11/09/17 1009 11/10/17 0635  Weight: 300 lb (136.1 kg) (!) 313 lb 12.8 oz (142.3 kg) (!) 307 lb 4.8 oz (139.4 kg)   Body mass index is 45.38 kg/m.   General: Obese, Well developed, well nourished, NAD Skin: Warm, dry, intact  Head: Normocephalic, atraumatic, clear, moist mucus membranes. Neck: Negative for carotid bruits. No JVD Lungs:Diffusly diminished throughout all fields. No wheezes, rales, or rhonchi. Breathing is unlabored. Cardiovascular: RRR with S1 S2. No murmurs, rubs, or gallops Abdomen: Soft, non-tender, non-distended with normoactive bowel sounds. No obvious abdominal masses. MSK: Strength and tone appear normal for age. 5/5 in all extremities Extremities: 1+ edema. No clubbing or cyanosis. DP/PT pulses  2+ bilaterally Neuro: Alert and oriented. No focal deficits. No facial asymmetry. MAE spontaneously. Psych: Responds to questions appropriately with normal affect.    EKG:  The EKG was personally reviewed and demonstrates: 11/10/17- NSR/ST 102 Telemetry:  Telemetry was personally reviewed and demonstrates: 11/10/17- 92  Relevant CV Studies:  ECHO: 11/09/17 Study Conclusions  - Left ventricle: The cavity size was normal. Systolic function was   normal. The estimated ejection fraction was in the range of 15%   to 20%. Severe diffuse hypokinesis. Regional wall motion   abnormalities cannot be excluded. Doppler parameters are   consistent with a reversible restrictive pattern, indicative of   decreased left ventricular diastolic compliance and/or increased   left atrial pressure (grade 3 diastolic dysfunction). - Mitral valve: There was mild regurgitation. - Left atrium: The atrium was moderately to severely dilated.  ECHO 10/23/2015: Study Conclusions  - Left ventricle: The cavity size was normal. Wall thickness was   increased in a pattern of mild LVH. Systolic function was mildly   to moderately reduced. The estimated ejection fraction was in the   range of 40% to 45%. There is hypokinesis of the   mid-apicalanterolateral myocardium. Doppler parameters are   consistent with abnormal left ventricular relaxation (grade 1   diastolic dysfunction).  Impressions:  - Compared to the prior study, there has been no significant   interval change.  CATH: Unable to find report   Laboratory Data:  Chemistry Recent Labs  Lab 11/09/17 0454 11/10/17 0703  NA 135 135  K 4.5 4.1  CL 106 102  CO2 20* 25  GLUCOSE 313* 189*  BUN 28* 25*  CREATININE 1.64* 1.52*  CALCIUM 8.8* 9.3  GFRNONAA 45* 49*  GFRAA 52* 57*  ANIONGAP 9 8    Total Protein  Date Value Ref Range Status  11/09/2017 6.6 6.5 - 8.1 g/dL Final   Albumin  Date Value Ref Range Status  11/09/2017 3.1 (L) 3.5 -  5.0 g/dL Final   AST  Date Value Ref Range Status  11/09/2017 35 15 - 41 U/L Final   ALT  Date Value Ref Range Status  11/09/2017 34 17 - 63 U/L Final   Alkaline Phosphatase  Date Value Ref Range Status  11/09/2017 75 38 - 126 U/L Final   Total Bilirubin  Date Value Ref Range Status  11/09/2017 0.6 0.3 - 1.2 mg/dL Final   Hematology Recent Labs  Lab 11/09/17 0454  WBC 7.4  RBC 4.28  HGB 13.1  HCT 41.3  MCV 96.5  MCH 30.6  MCHC 31.7  RDW 13.9  PLT 186   Cardiac EnzymesNo results for input(s): TROPONINI in the last 168 hours.  Recent Labs  Lab 11/09/17 0514  TROPIPOC 0.02    BNP Recent Labs  Lab 11/09/17 0454  BNP 48.5    DDimer No results for input(s): DDIMER in the last 168 hours. TSH: No results found for: TSH Lipids: Lab Results  Component Value Date   CHOL (H) 12/18/2010    207        ATP III CLASSIFICATION:  <200     mg/dL   Desirable  161-096  mg/dL   Borderline High  >=045    mg/dL   High          HDL 41 12/18/2010   LDLCALC  12/18/2010    96        Total Cholesterol/HDL:CHD Risk Coronary Heart Disease Risk Table                     Men   Women  1/2 Average Risk   3.4   3.3  Average Risk       5.0   4.4  2 X Average Risk   9.6   7.1  3 X Average Risk  23.4   11.0        Use the calculated Patient Ratio above and the CHD Risk Table to determine the patient's CHD Risk.        ATP III CLASSIFICATION (LDL):  <100     mg/dL   Optimal  409-811  mg/dL   Near or Above                    Optimal  130-159  mg/dL   Borderline  914-782  mg/dL   High  >956     mg/dL   Very High   TRIG 213 (H) 12/18/2010   CHOLHDL 5.0 12/18/2010   HgbA1c: Lab Results  Component Value Date   HGBA1C 7.4 (H) 11/09/2017    Radiology/Studies:  Dg Chest Port 1 View  Result Date: 11/09/2017 CLINICAL DATA:  Acute onset of shortness of breath and cough. Decreased O2 saturation. Diaphoresis. EXAM: PORTABLE CHEST 1 VIEW COMPARISON:  Chest radiograph performed  03/02/2011 FINDINGS: The lungs are well-aerated. Vascular congestion is noted. Bilateral central airspace opacities raise concern for pulmonary edema. There is no evidence of pleural effusion or pneumothorax. The cardiomediastinal silhouette is within normal limits. No acute osseous abnormalities are seen. IMPRESSION: Vascular congestion. Bilateral central airspace opacities raise concern for pulmonary edema. Electronically Signed   By: Roanna Raider M.D.   On: 11/09/2017 05:03    Assessment  and Plan:   1. Acute on chronic combined systolic and diastolic heart failure:  -Echo revealed decrease in EF to 15-20% -CXR showed pulmonary edema consistent with heart failure -Pt reports that his PCP discontinued his lisinopril several months ago due to issues with hypotension>> IM started Entresto 24-26 mg BID -Monitor BP closely, 143/92>134/87>123/75>153/99 -Pt on carvedilol 6.25mg  -ASA 81mg   -Continue Lasix to 40mg  IV BID  -Cr slightly elevated at 1.52, baseline in 2012 was around 1.2-1.3 -Daily weights and strict I&O -Weight on admission, 313lb> 307lb now -Plan to continue diuresing over the weekend, monitor renal function and right and left cardiac cath on Monday   2. AKI:  -Secondary to acute heart failure -Follow renal function closely -Will adjust as necessary -Avoid nephrotoxic therapies  3. Acute respiratory failure with hypoxia: -Secondary to heart failure exacerbation -Stable, pt on room air oxygenation with saturations 96-100%  4. Insulin dependent DM II:  - HB A1c, 7.4 -Glucose on admission, 313 -Continue to monitor CBG AC/HS -On home regimen, Lantus, Victoza  For questions or updates, please contact [facility] Please consult [email or web address] for contact info under Cardiology/STEMI.   Signed, Georgie Chard, NP  11/10/2017 1:54 PM   I have personally seen and examined this patient. I agree with the assessment and plan as outlined above.  Miguel Ho is an obese AAM  with history of NICM with LVEF around 40-45% by echo in 2016 who is admitted with volume overload. His echo yesterday shows drop in LVEF to 15-20%. He admits to dietary non-compliance over the holidays. He has noted a gradual worsening of LE edema over the past few weeks. He has been diuresed over the last 24 hours with IV Lasix and is feeling much better.  My exam shows an obese male in NAD. It is difficult to assess for JVD given his neck size. CV:RRR with soft systolic murmur noted. Lungs: clear bilaterally. Abd: soft, NT, ND. Ext: 1 + bilateral LE edema.  Labs reviewed by me. Creatinine is 1.5 today (baseline likely around 1.3).  I have reviewed his EKG and it shows sinus with T wave flattening in the inferior leads.  Plan:  1. Acute on chronic systolic/diastolic CHF: he remains volume overloaded. This is likely a combination of his known cardiomyopathy and recent dietary indiscretion. He is responding well to IV lasix. Would continue IV lasix today.  2. Cardiomyopathy: Previously felt to be non-ischemic. Given recent drop in LVEF and his history of insulin dependent DM, would consider cath before discharge. We will place him on the cath board for Monday of next week and plan a right/left heart cath. Continue Coreg. Sherryll Burger has been started by the primary team. Can titrate Coreg and Entresto for BP control.  3. CKD: follow renal function closely with addition of Entresto and use of IV Lasix.   We will follow with you.   Verne Carrow 11/10/2017 3:42 PM

## 2017-11-10 NOTE — Progress Notes (Signed)
    The risks and benefits of a cardiac catheterization including, but not limited to, death, stroke, MI, kidney damage and bleeding were discussed with the patient who indicates understanding and agrees to proceed on 11/13/2017.  Pre-cath hydration orders placed to start at 0600 on 11/13/17 at 84ml/hr due to acute on chronic systolic/diastolic heart failure with s/s of severe, symptomatic fluid volume overload with AKI (Cr, 1.52) on hospital presentation.   Georgie Chard, NP

## 2017-11-10 NOTE — Progress Notes (Addendum)
Benefit check in progress for Miguel Ho [facility] [phone]  RE: Benefit check  Received: Today  Message Contents  Memory Argue CMA        # 6.  S/W ERIN @  [employer]  RX # [id number]    1. ENTRESTO 24-26 MG TABLET   COVER- YES  CO-PAY- $ 60.00  TIER- 3 DRUG  PRIOR APPROVAL- YES # [id number] FOR EXCEPTION   2. SACUBITRIL-VALSARTAN  24-26 MG TABLET   COVER- YES  CO-PAY- $ 60.00  TIER- 3 DRUG  PRIOR APPROVAL- NO   DEDUCTIBLE FOR 2018 HAS BEEN MET   PREFERRED PHARMACY : [facility]

## 2017-11-10 NOTE — Progress Notes (Signed)
Heart Failure Navigator Consult Note  Presentation: Per Junious Silk NP: [name] is a 56 y.o. male with medical history significant for obesity, OSA on CPAP, hypertension, gout, known chronic combined systolic and diastolic heart failure, dyslipidemia and diabetes on insulin.  Patient was in the usual state of health until he awakened early this morning with significant shortness of breath and wheezing.  He wore his CPAP to bed per his usual routine.  His dyspnea was marked enough that he was unable to make it to the car to try to come to the ER so EMS was called.  Upon EMS arrival the patient's O2 saturations were noted to be 80%.  Upon arrival to the ER he was tachycardic, hypertensive and hypoxemic.  Chest x-ray revealed vascular congestion consistent with heart failure exacerbation.  He was initially given Nitropaste but given his acute respiratory symptoms this was changed to a nitro drip at 5 mcg/min.  He was placed on BiPAP with 40% FiO2.  He was given Lasix 40 mg IV x1.  Since that time patient's respiratory status has stabilized.  His blood pressure has decreased to normal ranges.  He has diuresed greater than 1 L.  And he has been transitioned from BiPAP to 3 L of oxygen.  Patient will be admitted to telemetry with a diagnosis of acute on chronic systolic and diastolic heart failure.  Note BNP was not elevated.   Past Medical History:  Diagnosis Date  . Childhood asthma   . Chronic combined systolic and diastolic heart failure (HCC) 10/12/2015  . Chronic systolic heart failure (HCC) 10/12/2015  . Diabetic peripheral neuropathy (HCC)   . Essential hypertension 10/12/2015  . GERD (gastroesophageal reflux disease)   . Gout    "on daily RX" (11/09/2017)  . Hyperlipidemia 10/12/2015  . Hypertension   . Morbid obesity (HCC) 10/12/2015  . OSA on CPAP   . Type II diabetes mellitus (HCC)     Social History   Socioeconomic History  . Marital status: Married    Spouse name: None   . Number of children: None  . Years of education: None  . Highest education level: None  Social Needs  . Financial resource strain: None  . Food insecurity - worry: None  . Food insecurity - inability: None  . Transportation needs - medical: None  . Transportation needs - non-medical: None  Occupational History  . None  Tobacco Use  . Smoking status: Never Smoker  . Smokeless tobacco: Never Used  Substance and Sexual Activity  . Alcohol use: Yes    Comment: 11/09/2017 "on major holidays I might have a drink; last drink was 02/2017"  . Drug use: No  . Sexual activity: None  Other Topics Concern  . None  Social History Narrative   Epworth scale per 10/12/15 is a 3    ECHO:Study Conclusions--11/09/17  - Left ventricle: The cavity size was normal. Systolic function was   normal. The estimated ejection fraction was in the range of 15%   to 20%. Severe diffuse hypokinesis. Regional wall motion   abnormalities cannot be excluded. Doppler parameters are   consistent with a reversible restrictive pattern, indicative of   decreased left ventricular diastolic compliance and/or increased   left atrial pressure (grade 3 diastolic dysfunction). - Mitral valve: There was mild regurgitation. - Left atrium: The atrium was moderately to severely dilated.   BNP    Component Value Date/Time   BNP 48.5 11/09/2017 0454    ProBNP No  results found for: PROBNP   Education Assessment and Provision:  Detailed education and instructions provided on heart failure disease management including the following:  Signs and symptoms of Heart Failure When to call the physician Importance of daily weights Low sodium diet Fluid restriction Medication management Anticipated future follow-up appointments  Patient education given on each of the above topics.  Patient acknowledges understanding and acceptance of all instructions.  I spoke with Miguel Ho regarding his HF and current  hospitalization.  He tells me that he sees Miguel Ho as an outpatient however has not been recently.  He also says that he has a scale and weighs "sometimes" at home.  We discussed the importance of daily weights and when to call the physician related to worsening signs and symptoms of HF.  He admits that he was eating high sodium foods over the holiday as well as drinking too many fluids.  I reinforced a low sodium diet and high sodium foods to avoid. He denies any issues getting or taking prescribed medications.  He lives in [place].  He will likely continue to follow with Dr Jacinto Ho after discharge.  Education Materials:  "Living Better With Heart Failure" Booklet, Daily Weight Tracker Tool    High Risk Criteria for Readmission and/or Poor Patient Outcomes:    EF <30%- Yes -Newly reduced-- 15-20 with grade 3 dias dys  2 or more admissions in 6 months-No  Difficult social situation- No  Demonstrates medication noncompliance-Denies    Barriers of Care:  Knowledge and compliance.  Discharge Planning:   Plans to return to home [place] in [place].

## 2017-11-10 NOTE — Evaluation (Signed)
Physical Therapy Evaluation Patient Details Name: Miguel LunaRonald A Radin MRN: [id number] DOB: Dec 01, 1960 Today's Date: 11/10/2017   History of Present Illness  56yo male who woke with significant shortness of breath and wheezing, he was unable to even get to the car and so was brought to the ED by EMS. Diagnosed with acute on chronic systolic and diastolic heart failure, AKI, acute respiratory failure with hypoxia. Gradually transitioned from BiPAP back to room air during his stay. PMH obesity, HTN, gout, chronic systolic and diastolic heart failure, DM.   Clinical Impression   Patient received standing at sink performing selfcare, very pleasant and willing to participate with skilled PT services this afternoon. Note patient able to don pants seated at EOB, good balance to pull pants all the way up in standing. Able to ambulate approximately 33600ft on room air while maintaining consistent conversation, no shortness of breath or other concerning signs of hypoxia noted during activity; no significant balance deficits noted during functional mobility and gait. Patient left standing at sink continuing with selfcare routine at end of session. Patient generally appears to be at his baseline level of function with no significant need for skilled PT services at this time or after discharge. PT signing off, thank you for the referral.     Follow Up Recommendations No PT follow up    Equipment Recommendations  None recommended by PT    Recommendations for Other Services       Precautions / Restrictions Precautions Precautions: None Restrictions Weight Bearing Restrictions: No      Mobility  Bed Mobility               General bed mobility comments: DNT received up in bathroom/at sink   Transfers Overall transfer level: Independent                  Ambulation/Gait Ambulation/Gait assistance: Modified independent (Device/Increase time) Ambulation Distance (Feet): 300 Feet Assistive  device: None Gait Pattern/deviations: WFL(Within Functional Limits)     General Gait Details: no significant gait deviations or balance impairment noted   Stairs            Wheelchair Mobility    Modified Rankin (Stroke Patients Only)       Balance Overall balance assessment: No apparent balance deficits (not formally assessed)                                           Pertinent Vitals/Pain Pain Assessment: No/denies pain    Home Living Family/patient expects to be discharged to:: Private residence Living Arrangements: Spouse/significant other;Children Available Help at Discharge: Family Type of Home: House Home Access: Level entry     Home Layout: Two level Home Equipment: None      Prior Function Level of Independence: Independent               Hand Dominance        Extremity/Trunk Assessment   Upper Extremity Assessment Upper Extremity Assessment: Overall WFL for tasks assessed    Lower Extremity Assessment Lower Extremity Assessment: Overall WFL for tasks assessed    Cervical / Trunk Assessment Cervical / Trunk Assessment: Normal  Communication   Communication: No difficulties  Cognition Arousal/Alertness: Awake/alert Behavior During Therapy: WFL for tasks assessed/performed Overall Cognitive Status: Within Functional Limits for tasks assessed  General Comments General comments (skin integrity, edema, etc.): able to ambulate approximately 325ft while maintaining consistent conversation, no SOB or signs of hypoxia noted     Exercises     Assessment/Plan    PT Assessment Patent does not need any further PT services  PT Problem List         PT Treatment Interventions      PT Goals (Current goals can be found in the Care Plan section)  Acute Rehab PT Goals Patient Stated Goal: to go home  PT Goal Formulation: With patient Time For Goal Achievement:  11/17/17 Potential to Achieve Goals: Good    Frequency     Barriers to discharge        Co-evaluation               AM-PAC PT "6 Clicks" Daily Activity  Outcome Measure Difficulty turning over in bed (including adjusting bedclothes, sheets and blankets)?: None Difficulty moving from lying on back to sitting on the side of the bed? : None Difficulty sitting down on and standing up from a chair with arms (e.g., wheelchair, bedside commode, etc,.)?: None Help needed moving to and from a bed to chair (including a wheelchair)?: None Help needed walking in hospital room?: None Help needed climbing 3-5 steps with a railing? : A Little 6 Click Score: 23    End of Session   Activity Tolerance: Patient tolerated treatment well Patient left: Other (comment);with call bell/phone within reach(standing at sink performing selfcare )   PT Visit Diagnosis: Muscle weakness (generalized) (M62.81)    Time: 1031-5945 PT Time Calculation (min) (ACUTE ONLY): 10 min   Charges:   PT Evaluation $PT Eval Low Complexity: 1 Low     PT G Codes:   PT G-Codes **NOT FOR INPATIENT CLASS** Functional Assessment Tool Used: AM-PAC 6 Clicks Basic Mobility;Clinical judgement Functional Limitation: Mobility: Walking and moving around Mobility: Walking and Moving Around Current Status (O5929): At least 1 percent but less than 20 percent impaired, limited or restricted Mobility: Walking and Moving Around Goal Status [phone]): At least 1 percent but less than 20 percent impaired, limited or restricted Mobility: Walking and Moving Around Discharge Status [phone]): At least 1 percent but less than 20 percent impaired, limited or restricted   Nedra Hai PT, DPT, CBIS  Supplemental Physical Therapist [facility]

## 2017-11-11 LAB — COMPREHENSIVE METABOLIC PANEL
ALBUMIN: 3.1 g/dL — AB (ref 3.5–5.0)
ALK PHOS: 67 U/L (ref 38–126)
ALT: 28 U/L (ref 17–63)
ANION GAP: 9 (ref 5–15)
AST: 28 U/L (ref 15–41)
BILIRUBIN TOTAL: 0.7 mg/dL (ref 0.3–1.2)
BUN: 33 mg/dL — ABNORMAL HIGH (ref 6–20)
CALCIUM: 9 mg/dL (ref 8.9–10.3)
CO2: 24 mmol/L (ref 22–32)
Chloride: 102 mmol/L (ref 101–111)
Creatinine, Ser: 1.74 mg/dL — ABNORMAL HIGH (ref 0.61–1.24)
GFR calc non Af Amer: 42 mL/min — ABNORMAL LOW (ref >60)
GFR, EST AFRICAN AMERICAN: 48 mL/min — AB (ref >60)
GLUCOSE: 188 mg/dL — AB (ref 65–99)
Potassium: 3.9 mmol/L (ref 3.5–5.1)
Sodium: 135 mmol/L (ref 135–145)
TOTAL PROTEIN: 6.5 g/dL (ref 6.5–8.1)

## 2017-11-11 LAB — CBC
HEMATOCRIT: 42 % (ref 39.0–52.0)
HEMOGLOBIN: 13.2 g/dL (ref 13.0–17.0)
MCH: 30.1 pg (ref 26.0–34.0)
MCHC: 31.4 g/dL (ref 30.0–36.0)
MCV: 95.7 fL (ref 78.0–100.0)
Platelets: 202 10*3/uL (ref 150–400)
RBC: 4.39 MIL/uL (ref 4.22–5.81)
RDW: 14 % (ref 11.5–15.5)
WBC: 7.7 10*3/uL (ref 4.0–10.5)

## 2017-11-11 LAB — GLUCOSE, CAPILLARY
GLUCOSE-CAPILLARY: 94 mg/dL (ref 65–99)
Glucose-Capillary: 180 mg/dL — ABNORMAL HIGH (ref 65–99)
Glucose-Capillary: 212 mg/dL — ABNORMAL HIGH (ref 65–99)
Glucose-Capillary: 115 mg/dL — ABNORMAL HIGH (ref 65–99)

## 2017-11-11 MED ORDER — ENOXAPARIN SODIUM 30 MG/0.3ML ~~LOC~~ SOLN
30.0000 mg | SUBCUTANEOUS | Status: DC
  Administered 2017-11-11 – 2017-11-12 (×2): 30 mg via SUBCUTANEOUS
  Filled 2017-11-11 (×2): qty 0.3

## 2017-11-11 MED ORDER — HYDRALAZINE HCL 25 MG PO TABS
25.0000 mg | ORAL_TABLET | Freq: Three times a day (TID) | ORAL | Status: DC
  Administered 2017-11-11 – 2017-11-12 (×3): 25 mg via ORAL
  Filled 2017-11-11 (×3): qty 1

## 2017-11-11 MED ORDER — FUROSEMIDE 10 MG/ML IJ SOLN
40.0000 mg | Freq: Every day | INTRAMUSCULAR | Status: DC
  Administered 2017-11-12: 40 mg via INTRAVENOUS
  Filled 2017-11-11: qty 4

## 2017-11-11 NOTE — Progress Notes (Signed)
Subjective:  Patient feels much better currently.  He has lost 14 pounds since admission with significant diuresis.  EF is down from previous.  Catheterization tentatively planned for Monday.  Complains of some numbness in his feet.  Objective:  Vital Signs in the last 24 hours: BP 111/60 (BP Location: Left Arm)   Pulse 78   Temp 98 F (36.7 C) (Oral)   Resp 18   Ht 5\' 9"  (1.753 m)   Wt 135.7 kg (299 lb 3.2 oz)   SpO2 93%   BMI 44.18 kg/m   Physical Exam: Pleasant obese black male in no acute distress Lungs:  Clear  Cardiac:  Regular rhythm, normal S1 and S2, no S3 Abdomen:  Soft, nontender, no masses Extremities:  1+  edema present  Intake/Output from previous day: 12/28 0701 - 12/29 0700 In: 840 [P.O.:840] Out: 2970 [Urine:2970] Weight Filed Weights   11/09/17 1009 11/10/17 0635 11/11/17 0612  Weight: (!) 142.3 kg (313 lb 12.8 oz) (!) 139.4 kg (307 lb 4.8 oz) 135.7 kg (299 lb 3.2 oz)    Lab Results: Basic Metabolic Panel: Recent Labs    11/10/17 0703 11/11/17 0434  NA 135 135  K 4.1 3.9  CL 102 102  CO2 25 24  GLUCOSE 189* 188*  BUN 25* 33*  CREATININE 1.52* 1.74*    CBC: Recent Labs    11/09/17 0454 11/11/17 0434  WBC 7.4 7.7  HGB 13.1 13.2  HCT 41.3 42.0  MCV 96.5 95.7  PLT 186 202    BNP    Component Value Date/Time   BNP 48.5 11/09/2017 0454    Telemetry: Sinus rhythm, personally reviewed  Assessment/Plan:   1.  Acute on chronic systolic heart failure 2.  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  Worsening of chronic renal insufficiency may be due to valsartan as well as diuresis  Recommendations:  Plan for him to have catheterization Monday.  Watch renal function closely.  Hold valsartan right now and may need to cut back diuresis if creatinine continues to climb.  Add hydralazine since he is African-American.  Darden Palmer  MD [facility]  11/11/2017, 11:46 AM

## 2017-11-11 NOTE — Progress Notes (Signed)
Patient ID: Miguel Ho, male   DOB: 1961-11-07, 56 y.o.   MRN: [id number]                                                                PROGRESS NOTE                                                                                                                                                                                                             Patient Demographics:    Miguel Ho, is a 56 y.o. male, DOB - 1961-11-07, NWG:[id number]  Admit date - 11/09/2017   Admitting Physician Laverna PeaceShayla D Nettey, MD  Outpatient Primary MD for the patient is Ralene OkMoreira, Roy, MD  LOS - 2  Outpatient Specialists:    Chief Complaint  Patient presents with  . Shortness of Breath       Brief Narrative  56 y.o.malewith medical history significant forobesity, OSA on CPAP, hypertension, gout, known chronic combined systolic and diastolic heart failure, dyslipidemia and diabetes on insulin. Patient was in the usual state of health until he awakened early this morning with significant shortness of breath and wheezing. He wore his CPAP to bed per his usual routine. His dyspnea was marked enough that he was unable to make it to the car to try to come to the ER so EMS was called. Upon EMS arrival the patient's O2saturations were noted to be 80%. Upon arrival to the ER he was tachycardic, hypertensive and hypoxemic. Chest x-ray revealed vascular congestion consistent with heart failure exacerbation. He was initially given Nitropaste but given his acute respiratory symptoms this was changed to a nitro drip at 5 mcg/min. He was placed on BiPAP with 40% FiO2. He was given Lasix 40 mg IV x1. Since that time patient's respiratory status has stabilized. His blood pressure has decreased to normal ranges. He has diuresed greater than 1 L. And he has been transitioned from BiPAP to 3 L of oxygen. Patient will be admitted to telemetry with a diagnosis of acute on chronic systolic and diastolic heart  failure. Note BNP was not elevated.  ED Course: Vital Signs:BP (!) 134/97  Pulse (!) 102  Temp (!) 97.4 F (36.3 C)  Resp 18  Ht 5\' 9"  (1.753 m)  Wt 136.1 kg (300 lb)  SpO2 95%  BMI 44.30 kg/m  PCXR:Vascular congestion with bilateral central airspace opacities concerning for pulmonary edema Lab data:Sodium 135, potassium 4.5, chloride 106, CO2 20, glucose 313, BUN 28, creatinine 1.64, calcium 8.8, anion gap 9, GFR 52, BNP 48.5, POCtroponin 0 0.02, lactic acid 2.18 with repeat 2.01 context of preadmission hypoxemia, white count 7400 differential not obtained, hemoglobin 13.1, platelets 186,000, hemoglobin A1c 7.4 Medications and treatments:Nitroglycerin paste 1 inch x1, nitroglycerin infusion 77mcg/minnow discontinued, Lasix 40 mg IV x1      Subjective:    Miguel German today denies dyspnea, cp, palp, orthopnea, pnd, or lower ext edema. Creatinine slightly increased may be due to diuresis vs entresto.    No headache, No abdominal pain - No Nausea, No new weakness tingling or numbness, No Cough - SOB.    Assessment  & Plan :    Principal Problem:   Acute on chronic combined systolic and diastolic heart failure (HCC) Active Problems:   Acute kidney injury (HCC)   Insulin dependent type 2 diabetes mellitus, uncontrolled (HCC)   Hypertension   History of sleep apnea   HLD (hyperlipidemia)   Acute respiratory failure with hypoxia (HCC)   NICM (nonischemic cardiomyopathy) (HCC)     Acute on chronic combined systolic and diastolic heart failure  -Patient presents with abrupt nocturnal awakening secondary to shortness of breath and documented hypoxemia with chest x-ray consistent with pulmonary edema. Echocardiogram from 2016 documents grade 1 diastolic dysfunction as well as mild systolic dysfunction with an EF of 40-45%. Continue carvedilol ContinueNTGpaste for now Review of cardiology outpatient documentation in 2017 patient was not started on  spironolactone due to low blood pressure issues Prior normal cath Patient has never required hospitalization for heart failure exacerbation Cardiac echo 11/09/2017= EF 15%, try on entresto.  Decrease lasix to 40mg  iv qday (12/29), from bid due to increase in creatinine and pending cath 12/31 Continue entresto, started (12/28) Appreciate cardiology input  Acute kidney injury   Secondary to acute heart failure physiology/hypoxemia Check cmp in am  Acute respiratory failure with hypoxia  Secondary to acute heart failure exacerbation ? Continue supportive care with oxygen;remained stable off BiPAP since weaned  Insulin dependent type 2 diabetes mellitus, uncontrolled  Presenting glucose 313 in the context of acute physiologic stress/increased cortisol secretion HgbA1c7.4 Continue Lantus and Victoza CBGsac/hs SSI  Hypertension Continue beta-blocker and diuretic as above, Add entresto  History of sleep apnea Continue nocturnal CPAP  HLD (hyperlipidemia) Continue therapeutic substitution for Mevacor, continue omega-3 fatty acids     DVT prophylaxis:Lovenox Code Status:Full Family Communication:No family at bedside Disposition Plan:Home Consults called:None     Lab Results  Component Value Date   PLT 202 11/11/2017    Anti-infectives (From admission, onward)   None        Objective:   Vitals:   11/10/17 1257 11/10/17 2114 11/11/17 0000 11/11/17 0612  BP: (!) 143/92 (!) 127/97  111/60  Pulse: 98 (!) 111 98 78  Resp: 18 18 16 18   Temp: 97.8 F (36.6 C) 98.4 F (36.9 C)  98 F (36.7 C)  TempSrc: Oral Oral  Oral  SpO2: 96% 98% 98% 93%  Weight:    135.7 kg (299 lb 3.2 oz)  Height:        Wt Readings from Last 3 Encounters:  11/11/17 135.7 kg (299 lb 3.2 oz)  04/07/16 135.4 kg (298 lb 9.6 oz)  10/12/15 (!) 142.6 kg (314 lb 4.8 oz)     Intake/Output Summary (Last 24 hours) at 11/11/2017  1610 Last data filed at 11/11/2017 0600 Gross  per 24 hour  Intake 840 ml  Output 2620 ml  Net -1780 ml     Physical Exam  Awake Alert, Oriented X 3, No new F.N deficits, Normal affect [place].AT,PERRAL Supple Neck,No JVD, No cervical lymphadenopathy appriciated.  Symmetrical Chest wall movement, Good air movement bilaterally, CTAB RRR,No Gallops,Rubs or new Murmurs, No Parasternal Heave +ve B.Sounds, Abd Soft, No tenderness, No organomegaly appriciated, No rebound - guarding or rigidity. No Cyanosis, Clubbing or edema, No new Rash or bruise     Data Review:    CBC Recent Labs  Lab 11/09/17 0454 11/11/17 0434  WBC 7.4 7.7  HGB 13.1 13.2  HCT 41.3 42.0  PLT 186 202  MCV 96.5 95.7  MCH 30.6 30.1  MCHC 31.7 31.4  RDW 13.9 14.0    Chemistries  Recent Labs  Lab 11/09/17 0454 11/10/17 0703 11/11/17 0434  NA 135 135 135  K 4.5 4.1 3.9  CL 106 102 102  CO2 20* 25 24  GLUCOSE 313* 189* 188*  BUN 28* 25* 33*  CREATININE 1.64* 1.52* 1.74*  CALCIUM 8.8* 9.3 9.0  AST 35  --  28  ALT 34  --  28  ALKPHOS 75  --  67  BILITOT 0.6  --  0.7   ------------------------------------------------------------------------------------------------------------------ No results for input(s): CHOL, HDL, LDLCALC, TRIG, CHOLHDL, LDLDIRECT in the last 72 hours.  Lab Results  Component Value Date   HGBA1C 7.4 (H) 11/09/2017   ------------------------------------------------------------------------------------------------------------------ No results for input(s): TSH, T4TOTAL, T3FREE, THYROIDAB in the last 72 hours.  Invalid input(s): FREET3 ------------------------------------------------------------------------------------------------------------------ No results for input(s): VITAMINB12, FOLATE, FERRITIN, TIBC, IRON, RETICCTPCT in the last 72 hours.  Coagulation profile No results for input(s): INR, PROTIME in the last 168 hours.  No results for input(s): DDIMER in the last 72 hours.  Cardiac Enzymes No results for input(s):  CKMB, TROPONINI, MYOGLOBIN in the last 168 hours.  Invalid input(s): CK ------------------------------------------------------------------------------------------------------------------    Component Value Date/Time   BNP 48.5 11/09/2017 0454    Inpatient Medications  Scheduled Meds: . allopurinol  300 mg Oral Daily  . aspirin EC  81 mg Oral Daily  . carvedilol  6.25 mg Oral BID WC  . enoxaparin (LOVENOX) injection  40 mg Subcutaneous Q24H  . furosemide  40 mg Intravenous Q12H  . gabapentin  300 mg Oral QHS  . insulin aspart  0-5 Units Subcutaneous QHS  . insulin aspart  0-9 Units Subcutaneous TID WC  . insulin detemir  70 Units Subcutaneous Daily  . liraglutide  1.8 mg Subcutaneous Daily  . nitroGLYCERIN  0.5 inch Topical Q6H  . omega-3 acid ethyl esters  1 g Oral BID  . potassium chloride  20 mEq Oral BID  . pravastatin  40 mg Oral q1800  . sacubitril-valsartan  1 tablet Oral BID  . sodium chloride flush  3 mL Intravenous Q12H   Continuous Infusions: . sodium chloride     PRN Meds:.sodium chloride, acetaminophen, ondansetron (ZOFRAN) IV, sodium chloride flush  Micro Results No results found for this or any previous visit (from the past 240 hour(s)).  Radiology Reports Dg Chest Port 1 View  Result Date: 11/09/2017 CLINICAL DATA:  Acute onset of shortness of breath and cough. Decreased O2 saturation. Diaphoresis. EXAM: PORTABLE CHEST 1 VIEW COMPARISON:  Chest radiograph performed 03/02/2011 FINDINGS: The lungs are well-aerated. Vascular congestion is noted. Bilateral central airspace opacities raise concern for pulmonary edema. There is no evidence of pleural  effusion or pneumothorax. The cardiomediastinal silhouette is within normal limits. No acute osseous abnormalities are seen. IMPRESSION: Vascular congestion. Bilateral central airspace opacities raise concern for pulmonary edema. Electronically Signed   By: Roanna Raider M.D.   On: 11/09/2017 05:03    Time Spent in  minutes  30   Pearson Grippe M.D on 11/11/2017 at 8:47 AM  Between 7am to 7pm - Pager - [phone]  After 7pm go to [email or web address] - password [facility]  [facility] -  Office  [phone]

## 2017-11-12 DIAGNOSIS — I159 Secondary hypertension, unspecified: Secondary | ICD-10-CM

## 2017-11-12 LAB — COMPREHENSIVE METABOLIC PANEL
ALT: 29 U/L (ref 17–63)
ANION GAP: 7 (ref 5–15)
AST: 30 U/L (ref 15–41)
Albumin: 3 g/dL — ABNORMAL LOW (ref 3.5–5.0)
Alkaline Phosphatase: 65 U/L (ref 38–126)
BILIRUBIN TOTAL: 0.6 mg/dL (ref 0.3–1.2)
BUN: 32 mg/dL — AB (ref 6–20)
CO2: 25 mmol/L (ref 22–32)
Calcium: 9 mg/dL (ref 8.9–10.3)
Chloride: 104 mmol/L (ref 101–111)
Creatinine, Ser: 1.73 mg/dL — ABNORMAL HIGH (ref 0.61–1.24)
GFR, EST AFRICAN AMERICAN: 49 mL/min — AB (ref >60)
GFR, EST NON AFRICAN AMERICAN: 42 mL/min — AB (ref >60)
Glucose, Bld: 154 mg/dL — ABNORMAL HIGH (ref 65–99)
POTASSIUM: 4.5 mmol/L (ref 3.5–5.1)
Sodium: 136 mmol/L (ref 135–145)
TOTAL PROTEIN: 6.4 g/dL — AB (ref 6.5–8.1)

## 2017-11-12 LAB — CBC
HEMATOCRIT: 41.9 % (ref 39.0–52.0)
Hemoglobin: 13.2 g/dL (ref 13.0–17.0)
MCH: 30.4 pg (ref 26.0–34.0)
MCHC: 31.5 g/dL (ref 30.0–36.0)
MCV: 96.5 fL (ref 78.0–100.0)
Platelets: 215 10*3/uL (ref 150–400)
RBC: 4.34 MIL/uL (ref 4.22–5.81)
RDW: 14.1 % (ref 11.5–15.5)
WBC: 7.4 10*3/uL (ref 4.0–10.5)

## 2017-11-12 LAB — GLUCOSE, CAPILLARY
GLUCOSE-CAPILLARY: 140 mg/dL — AB (ref 65–99)
GLUCOSE-CAPILLARY: 169 mg/dL — AB (ref 65–99)
GLUCOSE-CAPILLARY: 86 mg/dL (ref 65–99)
Glucose-Capillary: 164 mg/dL — ABNORMAL HIGH (ref 65–99)

## 2017-11-12 MED ORDER — SODIUM CHLORIDE 0.9 % IV SOLN: INTRAVENOUS | Status: DC

## 2017-11-12 MED ORDER — SODIUM CHLORIDE 0.9 % IV SOLN
INTRAVENOUS | Status: DC
  Administered 2017-11-13: 07:00:00 via INTRAVENOUS

## 2017-11-12 MED ORDER — SODIUM CHLORIDE 0.9 % IV SOLN: 250.0000 mL | INTRAVENOUS | Status: DC | PRN

## 2017-11-12 MED ORDER — SODIUM CHLORIDE 0.9% FLUSH
3.0000 mL | Freq: Two times a day (BID) | INTRAVENOUS | Status: DC
  Administered 2017-11-12: 3 mL via INTRAVENOUS

## 2017-11-12 MED ORDER — ASPIRIN 81 MG PO CHEW
81.0000 mg | CHEWABLE_TABLET | ORAL | Status: AC
  Administered 2017-11-13: 81 mg via ORAL
  Filled 2017-11-12: qty 1

## 2017-11-12 MED ORDER — HYDRALAZINE HCL 50 MG PO TABS
50.0000 mg | ORAL_TABLET | Freq: Three times a day (TID) | ORAL | Status: DC
  Administered 2017-11-12 – 2017-11-13 (×4): 50 mg via ORAL
  Filled 2017-11-12 (×4): qty 1

## 2017-11-12 MED ORDER — SODIUM CHLORIDE 0.9% FLUSH: 3.0000 mL | Freq: Two times a day (BID) | INTRAVENOUS | Status: DC

## 2017-11-12 MED ORDER — SODIUM CHLORIDE 0.9% FLUSH: 3.0000 mL | INTRAVENOUS | Status: DC | PRN

## 2017-11-12 NOTE — Progress Notes (Signed)
Pt wearing CPAP at night- Pt able to place and remove mask as needed.  Pt aware of use of CPAP and will call if any needs.

## 2017-11-12 NOTE — Progress Notes (Signed)
Patient ID: [name], male   DOB: [birth year], 56 y.o.   MRN: [id number]                                                                PROGRESS NOTE                                                                                                                                                                                                             Patient Demographics:    Miguel Ho, is a 56 y.o. male, DOB - 11/27/1960, VHQ:[id number]  Admit date - 11/09/2017   Admitting Physician Miguel Peace, MD  Outpatient Primary MD for the patient is Miguel Ok, MD  LOS - 3  Outpatient Specialists:     Chief Complaint  Patient presents with  . Shortness of Breath       Brief Narrative  56 y.o.malewith medical history significant forobesity, OSA on CPAP, hypertension, gout, known chronic combined systolic and diastolic heart failure, dyslipidemia and diabetes on insulin. Patient was in the usual state of health until he awakened early this morning with significant shortness of breath and wheezing. He wore his CPAP to bed per his usual routine. His dyspnea was marked enough that he was unable to make it to the car to try to come to the ER so EMS was called. Upon EMS arrival the patient's O2saturations were noted to be 80%. Upon arrival to the ER he was tachycardic, hypertensive and hypoxemic. Chest x-ray revealed vascular congestion consistent with heart failure exacerbation. He was initially given Nitropaste but given his acute respiratory symptoms this was changed to a nitro drip at 5 mcg/min. He was placed on BiPAP with 40% FiO2. He was given Lasix 40 mg IV x1. Since that time patient's respiratory status has stabilized. His blood pressure has decreased to normal ranges. He has diuresed greater than 1 L. And he has been transitioned from BiPAP to 3 L of oxygen. Patient will be admitted to telemetry with a diagnosis of acute on chronic systolic and diastolic heart  failure. Note BNP was not elevated.  ED Course: Vital Signs:BP (!) 134/97  Pulse (!) 102  Temp (!) 97.4 F (36.3 C)  Resp 18  Ht 5\' 9"  (1.753 m)  Wt 136.1 kg (300 lb)  SpO2  95%  BMI 44.30 kg/m  PCXR:Vascular congestion with bilateral central airspace opacities concerning for pulmonary edema Lab data:Sodium 135, potassium 4.5, chloride 106, CO2 20, glucose 313, BUN 28, creatinine 1.64, calcium 8.8, anion gap 9, GFR 52, BNP 48.5, POCtroponin 0 0.02, lactic acid 2.18 with repeat 2.01 context of preadmission hypoxemia, white count 7400 differential not obtained, hemoglobin 13.1, platelets 186,000, hemoglobin A1c 7.4 Medications and treatments:Nitroglycerin paste 1 inch x1, nitroglycerin infusion 235mcg/minnow discontinued, Lasix 40 mg IV x1      Subjective:    Miguel Ho today states that breathing is good. Denies cp, palp, n/v, diarrhea, brbpr, blacks stool.  Awaiting cardiac cath tomorrow.     Assessment  & Plan :    Principal Problem:   Acute on chronic combined systolic and diastolic heart failure (HCC) Active Problems:   Acute kidney injury (HCC)   Insulin dependent type 2 diabetes mellitus, uncontrolled (HCC)   Hypertension   History of sleep apnea   HLD (hyperlipidemia)   Acute respiratory failure with hypoxia (HCC)   NICM (nonischemic cardiomyopathy) (HCC)     Acute on chronic combined systolic and diastolic heart failure  -Patient presents with abrupt nocturnal awakening secondary to shortness of breath and documented hypoxemia with chest x-ray consistent with pulmonary edema. Echocardiogram from 2016 documents grade 1 diastolic dysfunction as well as mild systolic dysfunction with an EF of 40-45%. Continue carvedilol ContinueNTGpaste for now Review of cardiology outpatient documentation in 2017 patient was not started on spironolactone due to low blood pressure issues Prior normal cath Patient has never required hospitalization for heart  failure exacerbation Cardiac echo 11/09/2017= EF 15%, try on entresto.  Decrease lasix to 40mg  iv qday (12/29), from bid due to increase in creatinine HOLD LASIX TODAY (12/30), due to renal insufficiency and cath 12/31 STOP Entresto, started (12/28)=>STOPPED on 12/29 due to renal insufficiency INCREASE  Hydralazine to 50mg  po tid for improved bp control (12/30) Appreciate cardiology input    Acute kidney injury  Secondary to acute heart failure physiology/hypoxemia HOLD LASIX TODAY Agree with STOPPING Entresto,and using hydralazine Appreciate cardiology input.   Acute respiratory failure with hypoxia  Secondary to acute heart failure exacerbation? Continue supportive care with oxygen;remained stable off BiPAP since weaned  Insulin dependent type 2 diabetes mellitus, uncontrolled  Presenting glucose 313 in the context of acute physiologic stress/increased cortisol secretion [id number] Continue Lantus and Victoza CBGsac/hs SSI  Hypertension Continue beta-blocker and diuretic as above,  Cont Hydralazine 25mg  po tid (started 12/29)  History of sleep apnea Continue nocturnal CPAP  HLD (hyperlipidemia) Continue therapeutic substitution for Mevacor, continue omega-3 fatty acids     DVT prophylaxis:Lovenox Code Status:Full Family Communication:No family at bedside Disposition Plan:Home Consults called:None        Lab Results  Component Value Date   PLT 215 11/12/2017      Anti-infectives (From admission, onward)   None        Objective:   Vitals:   11/11/17 0612 11/11/17 1100 11/11/17 2126 11/12/17 0558  BP: 111/60 138/86 (!) 153/98 (!) 137/97  Pulse: 78 (!) 106 98 (!) 107  Resp: 18 18 18 18   Temp: 98 F (36.7 C) 98.4 F (36.9 C) 98.1 F (36.7 C) (!) 97 F (36.1 C)  TempSrc: Oral Oral Oral Oral  SpO2: 93% 98% 100% 95%  Weight: 135.7 kg (299 lb 3.2 oz)   (!) 136.2 kg (300 lb 3.2 oz)  Height:        Wt Readings from Last 3  Encounters:  11/12/17 (!) 136.2 kg (300 lb 3.2 oz)  04/07/16 135.4 kg (298 lb 9.6 oz)  10/12/15 (!) 142.6 kg (314 lb 4.8 oz)     Intake/Output Summary (Last 24 hours) at 11/12/2017 0933 Last data filed at 11/12/2017 0651 Gross per 24 hour  Intake 480 ml  Output 2450 ml  Net -1970 ml     Physical Exam  Awake Alert, Oriented X 3, No new F.N deficits, Normal affect [place].AT,PERRAL Supple Neck,No JVD, No cervical lymphadenopathy appriciated.  Symmetrical Chest wall movement, Good air movement bilaterally, CTAB RRR, s1, s2, 1/6 sem apex +ve B.Sounds, Abd Soft, No tenderness, No organomegaly appriciated, No rebound - guarding or rigidity. No Cyanosis, Clubbing or edema, No new Rash or bruise     Data Review:    CBC Recent Labs  Lab 11/09/17 0454 11/11/17 0434 11/12/17 0445  WBC 7.4 7.7 7.4  HGB 13.1 13.2 13.2  HCT 41.3 42.0 41.9  PLT 186 202 215  MCV 96.5 95.7 96.5  MCH 30.6 30.1 30.4  MCHC 31.7 31.4 31.5  RDW 13.9 14.0 14.1    Chemistries  Recent Labs  Lab 11/09/17 0454 11/10/17 0703 11/11/17 0434 11/12/17 0445  NA 135 135 135 136  K 4.5 4.1 3.9 4.5  CL 106 102 102 104  CO2 20* 25 24 25   GLUCOSE 313* 189* 188* 154*  BUN 28* 25* 33* 32*  CREATININE 1.64* 1.52* 1.74* 1.73*  CALCIUM 8.8* 9.3 9.0 9.0  AST 35  --  28 30  ALT 34  --  28 29  ALKPHOS 75  --  67 65  BILITOT 0.6  --  0.7 0.6   ------------------------------------------------------------------------------------------------------------------ No results for input(s): CHOL, HDL, LDLCALC, TRIG, CHOLHDL, LDLDIRECT in the last 72 hours.  Lab Results  Component Value Date   HGBA1C 7.4 (H) 11/09/2017   ------------------------------------------------------------------------------------------------------------------ No results for input(s): TSH, T4TOTAL, T3FREE, THYROIDAB in the last 72 hours.  Invalid input(s):  FREET3 ------------------------------------------------------------------------------------------------------------------ No results for input(s): VITAMINB12, FOLATE, FERRITIN, TIBC, IRON, RETICCTPCT in the last 72 hours.  Coagulation profile No results for input(s): INR, PROTIME in the last 168 hours.  No results for input(s): DDIMER in the last 72 hours.  Cardiac Enzymes No results for input(s): CKMB, TROPONINI, MYOGLOBIN in the last 168 hours.  Invalid input(s): CK ------------------------------------------------------------------------------------------------------------------    Component Value Date/Time   BNP 48.5 11/09/2017 0454    Inpatient Medications  Scheduled Meds: . allopurinol  300 mg Oral Daily  . aspirin EC  81 mg Oral Daily  . carvedilol  6.25 mg Oral BID WC  . enoxaparin (LOVENOX) injection  30 mg Subcutaneous Q24H  . furosemide  40 mg Intravenous Daily  . gabapentin  300 mg Oral QHS  . hydrALAZINE  25 mg Oral Q8H  . insulin aspart  0-5 Units Subcutaneous QHS  . insulin aspart  0-9 Units Subcutaneous TID WC  . insulin detemir  70 Units Subcutaneous Daily  . liraglutide  1.8 mg Subcutaneous Daily  . nitroGLYCERIN  0.5 inch Topical Q6H  . omega-3 acid ethyl esters  1 g Oral BID  . potassium chloride  20 mEq Oral BID  . pravastatin  40 mg Oral q1800  . sodium chloride flush  3 mL Intravenous Q12H   Continuous Infusions: . sodium chloride     PRN Meds:.sodium chloride, acetaminophen, ondansetron (ZOFRAN) IV, sodium chloride flush  Micro Results No results found for this or any previous visit (from the past 240 hour(s)).  Radiology Reports  Dg Chest Port 1 View  Result Date: 11/09/2017 CLINICAL DATA:  Acute onset of shortness of breath and cough. Decreased O2 saturation. Diaphoresis. EXAM: PORTABLE CHEST 1 VIEW COMPARISON:  Chest radiograph performed 03/02/2011 FINDINGS: The lungs are well-aerated. Vascular congestion is noted. Bilateral central airspace  opacities raise concern for pulmonary edema. There is no evidence of pleural effusion or pneumothorax. The cardiomediastinal silhouette is within normal limits. No acute osseous abnormalities are seen. IMPRESSION: Vascular congestion. Bilateral central airspace opacities raise concern for pulmonary edema. Electronically Signed   By: Roanna Raider M.D.   On: 11/09/2017 05:03    Time Spent in minutes  30   Pearson Grippe M.D on 11/12/2017 at 9:33 AM  Between 7am to 7pm - Pager - [phone]  After 7pm go to [email or web address] - password [facility]  [facility] -  Office  [phone]

## 2017-11-12 NOTE — Progress Notes (Signed)
Subjective:  Continues to feel better in terms of shortness of breath.  No chest pain.  Questions answered about catheterization.  Objective:  Vital Signs in the last 24 hours: BP 126/85   Pulse (!) 109   Temp 97.6 F (36.4 C) (Oral)   Resp 18   Ht 5\' 9"  (1.753 m)   Wt (!) 136.2 kg (300 lb 3.2 oz)   SpO2 97%   BMI 44.33 kg/m   Physical Exam: Pleasant obese black male in no acute distress Lungs:  Clear  Cardiac:  Regular rhythm, normal S1 and S2, no S3 Abdomen:  Soft, nontender, no masses Extremities:  1+  edema present  Intake/Output from previous day: 12/29 0701 - 12/30 0700 In: 720 [P.O.:720] Out: 2850 [Urine:2850] Weight Filed Weights   11/10/17 0635 11/11/17 0612 11/12/17 0558  Weight: (!) 139.4 kg (307 lb 4.8 oz) 135.7 kg (299 lb 3.2 oz) (!) 136.2 kg (300 lb 3.2 oz)    Lab Results: Basic Metabolic Panel: Recent Labs    11/11/17 0434 11/12/17 0445  NA 135 136  K 3.9 4.5  CL 102 104  CO2 24 25  GLUCOSE 188* 154*  BUN 33* 32*  CREATININE 1.74* 1.73*    CBC: Recent Labs    11/11/17 0434 11/12/17 0445  WBC 7.7 7.4  HGB 13.2 13.2  HCT 42.0 41.9  MCV 95.7 96.5  PLT 202 215    BNP    Component Value Date/Time   BNP 48.5 11/09/2017 0454    Telemetry: Sinus rhythm, personally reviewed  Assessment/Plan:   1.  Acute on chronic systolic heart failure 2.  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  Chronic kidney disease-currently stable  Recommendations:  I held his valsartan yesterday in anticipation of cath and added hydralazine.  Recheck weights.  Catheterization planned tomorrow and again discussed with patient including risks.  Darden Palmer  MD [facility] Cardiology  11/12/2017, 12:54 PM

## 2017-11-12 NOTE — Progress Notes (Addendum)
Pt is stable throughout AM shift, vitals stable, ambulatory in a hallway, Consent taken, pt is aware about the procedure tomorrow and NPO since midnight, no any complain of pain, SOB and distress, will continue to monitor the patient Lonia Farber, RN

## 2017-11-13 ENCOUNTER — Telehealth: Payer: Self-pay | Admitting: Physician Assistant

## 2017-11-13 ENCOUNTER — Encounter (HOSPITAL_COMMUNITY)
Admission: EM | Disposition: A | Discharge status: Home / Self Care | Payer: Self-pay | Source: Home / Self Care | Attending: Internal Medicine

## 2017-11-13 DIAGNOSIS — E785 Hyperlipidemia, unspecified: Secondary | ICD-10-CM

## 2017-11-13 DIAGNOSIS — I10 Essential (primary) hypertension: Secondary | ICD-10-CM

## 2017-11-13 DIAGNOSIS — J9601 Acute respiratory failure with hypoxia: Secondary | ICD-10-CM

## 2017-11-13 DIAGNOSIS — N183 Chronic kidney disease, stage 3 (moderate): Secondary | ICD-10-CM

## 2017-11-13 HISTORY — PX: RIGHT/LEFT HEART CATH AND CORONARY ANGIOGRAPHY: CATH118266

## 2017-11-13 LAB — POCT I-STAT 3, ART BLOOD GAS (G3+)
ACID-BASE DEFICIT: 3 mmol/L — AB (ref 0.0–2.0)
Bicarbonate: 22.7 mmol/L (ref 20.0–28.0)
O2 SAT: 93 %
PCO2 ART: 41 mmHg (ref 32.0–48.0)
PO2 ART: 70 mmHg — AB (ref 83.0–108.0)
TCO2: 24 mmol/L (ref 22–32)
pH, Arterial: 7.351 (ref 7.350–7.450)

## 2017-11-13 LAB — COMPREHENSIVE METABOLIC PANEL
ALK PHOS: 76 U/L (ref 38–126)
ALT: 35 U/L (ref 17–63)
ANION GAP: 13 (ref 5–15)
AST: 31 U/L (ref 15–41)
Albumin: 3.7 g/dL (ref 3.5–5.0)
BUN: 31 mg/dL — ABNORMAL HIGH (ref 6–20)
CHLORIDE: 97 mmol/L — AB (ref 101–111)
CO2: 25 mmol/L (ref 22–32)
Calcium: 9.3 mg/dL (ref 8.9–10.3)
Creatinine, Ser: 1.76 mg/dL — ABNORMAL HIGH (ref 0.61–1.24)
GFR, EST AFRICAN AMERICAN: 48 mL/min — AB (ref >60)
GFR, EST NON AFRICAN AMERICAN: 41 mL/min — AB (ref >60)
GLUCOSE: 154 mg/dL — AB (ref 65–99)
POTASSIUM: 4 mmol/L (ref 3.5–5.1)
Sodium: 135 mmol/L (ref 135–145)
TOTAL PROTEIN: 7.6 g/dL (ref 6.5–8.1)
Total Bilirubin: 0.6 mg/dL (ref 0.3–1.2)

## 2017-11-13 LAB — POCT I-STAT 3, VENOUS BLOOD GAS (G3P V)
ACID-BASE DEFICIT: 2 mmol/L (ref 0.0–2.0)
ACID-BASE DEFICIT: 2 mmol/L (ref 0.0–2.0)
Bicarbonate: 24 mmol/L (ref 20.0–28.0)
Bicarbonate: 24.3 mmol/L (ref 20.0–28.0)
O2 Saturation: 64 %
O2 Saturation: 65 %
PH VEN: 7.333 (ref 7.250–7.430)
PH VEN: 7.336 (ref 7.250–7.430)
PO2 VEN: 35 mmHg (ref 32.0–45.0)
TCO2: 25 mmol/L (ref 22–32)
TCO2: 26 mmol/L (ref 22–32)
pCO2, Ven: 45 mmHg (ref 44.0–60.0)
pCO2, Ven: 45.8 mmHg (ref 44.0–60.0)
pO2, Ven: 36 mmHg (ref 32.0–45.0)

## 2017-11-13 LAB — CBC
HCT: 45.1 % (ref 39.0–52.0)
HEMOGLOBIN: 14.3 g/dL (ref 13.0–17.0)
MCH: 30.9 pg (ref 26.0–34.0)
MCHC: 31.7 g/dL (ref 30.0–36.0)
MCV: 97.4 fL (ref 78.0–100.0)
Platelets: 233 10*3/uL (ref 150–400)
RBC: 4.63 MIL/uL (ref 4.22–5.81)
RDW: 14.4 % (ref 11.5–15.5)
WBC: 8.4 10*3/uL (ref 4.0–10.5)

## 2017-11-13 LAB — PROTIME-INR
INR: 0.91
PROTHROMBIN TIME: 12.2 s (ref 11.4–15.2)

## 2017-11-13 LAB — GLUCOSE, CAPILLARY
GLUCOSE-CAPILLARY: 134 mg/dL — AB (ref 65–99)
Glucose-Capillary: 118 mg/dL — ABNORMAL HIGH (ref 65–99)

## 2017-11-13 SURGERY — RIGHT/LEFT HEART CATH AND CORONARY ANGIOGRAPHY
Anesthesia: LOCAL

## 2017-11-13 MED ORDER — VERAPAMIL HCL 2.5 MG/ML IV SOLN
INTRAVENOUS | Status: AC
  Filled 2017-11-13: qty 2

## 2017-11-13 MED ORDER — HEPARIN SODIUM (PORCINE) 1000 UNIT/ML IJ SOLN
INTRAMUSCULAR | Status: AC
  Filled 2017-11-13: qty 1

## 2017-11-13 MED ORDER — HEPARIN SODIUM (PORCINE) 5000 UNIT/ML IJ SOLN: 5000.0000 [IU] | Freq: Three times a day (TID) | INTRAMUSCULAR | Status: DC

## 2017-11-13 MED ORDER — SODIUM CHLORIDE 0.9% FLUSH: 3.0000 mL | INTRAVENOUS | Status: DC | PRN

## 2017-11-13 MED ORDER — FENTANYL CITRATE (PF) 100 MCG/2ML IJ SOLN
INTRAMUSCULAR | Status: DC | PRN
  Administered 2017-11-13: 25 ug via INTRAVENOUS

## 2017-11-13 MED ORDER — HEPARIN SODIUM (PORCINE) 1000 UNIT/ML IJ SOLN
INTRAMUSCULAR | Status: DC | PRN
  Administered 2017-11-13: 5000 [IU] via INTRAVENOUS

## 2017-11-13 MED ORDER — SODIUM CHLORIDE 0.9 % IV SOLN: INTRAVENOUS | Status: AC

## 2017-11-13 MED ORDER — HEPARIN (PORCINE) IN NACL 2-0.9 UNIT/ML-% IJ SOLN
INTRAMUSCULAR | Status: AC
  Filled 2017-11-13: qty 1000

## 2017-11-13 MED ORDER — LIDOCAINE HCL (PF) 1 % IJ SOLN
INTRAMUSCULAR | Status: AC
  Filled 2017-11-13: qty 30

## 2017-11-13 MED ORDER — IOPAMIDOL (ISOVUE-370) INJECTION 76%
INTRAVENOUS | Status: AC
Start: 2017-11-13 — End: 2017-11-13
  Filled 2017-11-13: qty 100

## 2017-11-13 MED ORDER — MIDAZOLAM HCL 2 MG/2ML IJ SOLN
INTRAMUSCULAR | Status: AC
  Filled 2017-11-13: qty 2

## 2017-11-13 MED ORDER — SODIUM CHLORIDE 0.9 % IV SOLN: 250.0000 mL | INTRAVENOUS | Status: DC | PRN

## 2017-11-13 MED ORDER — IOPAMIDOL (ISOVUE-370) INJECTION 76%
INTRAVENOUS | Status: DC | PRN
  Administered 2017-11-13: 40 mL via INTRA_ARTERIAL

## 2017-11-13 MED ORDER — MIDAZOLAM HCL 2 MG/2ML IJ SOLN
INTRAMUSCULAR | Status: DC | PRN
  Administered 2017-11-13: 1 mg via INTRAVENOUS

## 2017-11-13 MED ORDER — LIDOCAINE HCL (PF) 1 % IJ SOLN
INTRAMUSCULAR | Status: DC | PRN
  Administered 2017-11-13: 1 mL via INTRADERMAL
  Administered 2017-11-13: 2 mL via INTRADERMAL

## 2017-11-13 MED ORDER — HEPARIN (PORCINE) IN NACL 2-0.9 UNIT/ML-% IJ SOLN
INTRAMUSCULAR | Status: AC | PRN
  Administered 2017-11-13: 1000 mL via INTRA_ARTERIAL

## 2017-11-13 MED ORDER — FENTANYL CITRATE (PF) 100 MCG/2ML IJ SOLN
INTRAMUSCULAR | Status: AC
  Filled 2017-11-13: qty 2

## 2017-11-13 MED ORDER — HYDRALAZINE HCL 50 MG PO TABS: 50.0000 mg | ORAL_TABLET | Freq: Three times a day (TID) | ORAL | 0 refills | Status: DC

## 2017-11-13 MED ORDER — VERAPAMIL HCL 2.5 MG/ML IV SOLN
INTRAVENOUS | Status: DC | PRN
  Administered 2017-11-13: 11:00:00 via INTRA_ARTERIAL

## 2017-11-13 MED ORDER — SODIUM CHLORIDE 0.9% FLUSH: 3.0000 mL | Freq: Two times a day (BID) | INTRAVENOUS | Status: DC

## 2017-11-13 SURGICAL SUPPLY — 13 items
CATH 5FR JL3.5 JR4 ANG PIG MP (CATHETERS) ×1 IMPLANT
CATH BALLN WEDGE 5F 110CM (CATHETERS) ×1 IMPLANT
COVER PRB 48X5XTLSCP FOLD TPE (BAG) IMPLANT
COVER PROBE 5X48 (BAG) ×2
DEVICE RAD COMP TR BAND LRG (VASCULAR PRODUCTS) ×1 IMPLANT
GLIDESHEATH SLEND SS 6F .021 (SHEATH) ×1 IMPLANT
GUIDEWIRE INQWIRE 1.5J.035X260 (WIRE) IMPLANT
INQWIRE 1.5J .035X260CM (WIRE) ×2
KIT HEART LEFT (KITS) ×2 IMPLANT
PACK CARDIAC CATHETERIZATION (CUSTOM PROCEDURE TRAY) ×2 IMPLANT
SHEATH GLIDE SLENDER 4/5FR (SHEATH) ×1 IMPLANT
TRANSDUCER W/STOPCOCK (MISCELLANEOUS) ×2 IMPLANT
TUBING CIL FLEX 10 FLL-RA (TUBING) ×2 IMPLANT

## 2017-11-13 NOTE — Discharge Instructions (Signed)

## 2017-11-13 NOTE — Telephone Encounter (Signed)
Current hospital admission. 

## 2017-11-13 NOTE — Interval H&P Note (Signed)
History and Physical Interval Note:  11/13/2017 10:51 AM  Miguel Ho  has presented today for cardiac catheterization, with the diagnosis of acute on chronic systolic heart failure. The various methods of treatment have been discussed with the patient and family. After consideration of risks, benefits and other options for treatment, the patient has consented to  Procedure(s): RIGHT/LEFT HEART CATH AND CORONARY ANGIOGRAPHY (N/A) as a surgical intervention .  The patient's history has been reviewed, patient examined, no change in status, stable for surgery.  I have reviewed the patient's chart and labs.  Questions were answered to the patient's satisfaction.    Cath Lab Visit (complete for each Cath Lab visit)  Clinical Evaluation Leading to the Procedure:   ACS: No.  Non-ACS:    Anginal Classification: No Symptoms; presented with acute decompensated heart failure (NYHA class IV)  Anti-ischemic medical therapy: Minimal Therapy (1 class of medications)  Non-Invasive Test Results: No non-invasive testing performed (LVEF severely reduced by echo)  Prior CABG: No previous CABG  Constantine Ruddick

## 2017-11-13 NOTE — Progress Notes (Addendum)
Progress Note  Patient Name: Miguel LunaRonald A Nourse Date of Encounter: 11/13/2017  Primary Cardiologist: Duke Salviaandolph  Subjective   No chest pain or dyspnea.   Inpatient Medications    Scheduled Meds: . allopurinol  300 mg Oral Daily  . aspirin EC  81 mg Oral Daily  . carvedilol  6.25 mg Oral BID WC  . enoxaparin (LOVENOX) injection  30 mg Subcutaneous Q24H  . gabapentin  300 mg Oral QHS  . hydrALAZINE  50 mg Oral Q8H  . insulin aspart  0-5 Units Subcutaneous QHS  . insulin aspart  0-9 Units Subcutaneous TID WC  . insulin detemir  70 Units Subcutaneous Daily  . liraglutide  1.8 mg Subcutaneous Daily  . nitroGLYCERIN  0.5 inch Topical Q6H  . omega-3 acid ethyl esters  1 g Oral BID  . potassium chloride  20 mEq Oral BID  . pravastatin  40 mg Oral q1800  . sodium chloride flush  3 mL Intravenous Q12H  . sodium chloride flush  3 mL Intravenous Q12H  . sodium chloride flush  3 mL Intravenous Q12H   Continuous Infusions: . sodium chloride    . sodium chloride    . sodium chloride 40 mL/hr at 11/13/17 0635  . sodium chloride    . sodium chloride     PRN Meds: sodium chloride, sodium chloride, sodium chloride, acetaminophen, ondansetron (ZOFRAN) IV, sodium chloride flush, sodium chloride flush, sodium chloride flush   Vital Signs    Vitals:   11/12/17 0558 11/12/17 1224 11/12/17 1945 11/13/17 0511  BP: (!) 137/97 126/85 (!) 139/96 109/76  Pulse: (!) 107 (!) 109 (!) 115 (!) 103  Resp: 18 18 18 18   Temp: (!) 97 F (36.1 C) 97.6 F (36.4 C) 97.9 F (36.6 C) 98.2 F (36.8 C)  TempSrc: Oral Oral Oral Oral  SpO2: 95% 97% 99% 99%  Weight: (!) 300 lb 3.2 oz (136.2 kg)   (!) 301 lb 12.8 oz (136.9 kg)  Height:        Intake/Output Summary (Last 24 hours) at 11/13/2017 0927 Last data filed at 11/12/2017 1928 Gross per 24 hour  Intake 1080 ml  Output 1400 ml  Net -320 ml   Filed Weights   11/11/17 0612 11/12/17 0558 11/13/17 0511  Weight: 299 lb 3.2 oz (135.7 kg) (!) 300  lb 3.2 oz (136.2 kg) (!) 301 lb 12.8 oz (136.9 kg)    Telemetry    Sinus- Personally Reviewed  ECG     Physical Exam   GEN: No acute distress.   Neck: No JVD Cardiac: RRR, no murmurs, rubs, or gallops.  Respiratory: Clear to auscultation bilaterally. GI: Soft, nontender, non-distended  MS: No edema; No deformity. Neuro:  Nonfocal  Psych: Normal affect   Labs    Chemistry Recent Labs  Lab 11/11/17 0434 11/12/17 0445 11/13/17 0517  NA 135 136 135  K 3.9 4.5 4.0  CL 102 104 97*  CO2 24 25 25   GLUCOSE 188* 154* 154*  BUN 33* 32* 31*  CREATININE 1.74* 1.73* 1.76*  CALCIUM 9.0 9.0 9.3  PROT 6.5 6.4* 7.6  ALBUMIN 3.1* 3.0* 3.7  AST 28 30 31   ALT 28 29 35  ALKPHOS 67 65 76  BILITOT 0.7 0.6 0.6  GFRNONAA 42* 42* 41*  GFRAA 48* 49* 48*  ANIONGAP 9 7 13      Hematology Recent Labs  Lab 11/11/17 0434 11/12/17 0445 11/13/17 0517  WBC 7.7 7.4 8.4  RBC 4.39 4.34 4.63  HGB  13.2 13.2 14.3  HCT 42.0 41.9 45.1  MCV 95.7 96.5 97.4  MCH 30.1 30.4 30.9  MCHC 31.4 31.5 31.7  RDW 14.0 14.1 14.4  PLT 202 215 233    Cardiac EnzymesNo results for input(s): TROPONINI in the last 168 hours.  Recent Labs  Lab 11/09/17 0514  TROPIPOC 0.02     BNP Recent Labs  Lab 11/09/17 0454  BNP 48.5     DDimer No results for input(s): DDIMER in the last 168 hours.   Radiology    No results found.  Cardiac Studies   Echo 11/09/17: - Left ventricle: The cavity size was normal. Systolic function was   normal. The estimated ejection fraction was in the range of 15%   to 20%. Severe diffuse hypokinesis. Regional wall motion   abnormalities cannot be excluded. Doppler parameters are   consistent with a reversible restrictive pattern, indicative of   decreased left ventricular diastolic compliance and/or increased   left atrial pressure (grade 3 diastolic dysfunction). - Mitral valve: There was mild regurgitation. - Left atrium: The atrium was moderately to severely  dilated.  Patient Profile     56 y.o. male with history of chronic systolic/diastolic CHF, NICM, HTN, DM, OSA, obesity admitted with acute CHF and found to have worsened LV systolic function.   Assessment & Plan     1. Acute on chronic systolic/diastolic CHF: He has diuresed well with net negative 7.8 liters since admission. Lasix on hold today for cath. Will resume po Lasix post cath.    2. Cardiomyopathy: He has been felt to have a non-ischemic cardiomyopathy. With recent drop in LVEF, plans in place today for cardiac cath to exclude high grade CAD. Continue Coreg. Entresto on hold prior to cath.   3. CKD: Renal function stable this am. I suspect that his new baseline is around 1.6 to 1.7.   Addendum: Cardiac cath with no evidence of CAD. Filling pressures are ok. I would resume Lasix 40 mg po daily at home. Follow weights closely. Stress importance of dietary compliance with salt and fluid restriction. I would wait and restart the Entresto at f/u in the cardiology office given his renal insufficiency. We will arrange follow up in our office within one week.   For questions or updates, please contact [facility] Please consult [email or web address] for contact info under Cardiology/STEMI.      Signed, Verne Carrow, MD  11/13/2017, 9:27 AM

## 2017-11-13 NOTE — Progress Notes (Signed)
Pt has orders to be discharged. Discharge instructions given and pt has no additional questions at this time. Medication regimen reviewed and pt educated. Pt has and understands Living Better with Heart Failure book. Pt verbalized understanding and has no additional questions. Telemetry box removed. IV removed and site in good condition. Pt stable and waiting for transportation.

## 2017-11-13 NOTE — Discharge Summary (Signed)
Physician Discharge Summary  Wynona LunaRonald A Semper ZOX:[id number] DOB: 1961-03-08  PCP: Ralene OkMoreira, Roy, MD  Admit date: 11/09/2017 Discharge date: 11/13/2017  Recommendations for Outpatient Follow-up:  1. Dr. Ralene Okoy Moreira, PCP in one week with repeat labs (CBC & BMP). 2. [facility]: They will arrange office visit in 1 week.  Home Health: None Equipment/Devices: None    Discharge Condition: Improved and stable  CODE STATUS: Full  Diet recommendation: Heart healthy & diabetic diet.  Discharge Diagnoses:  Principal Problem:   Acute on chronic combined systolic and diastolic heart failure (HCC) Active Problems:   Acute kidney injury (HCC)   Insulin dependent type 2 diabetes mellitus, uncontrolled (HCC)   Hypertension   History of sleep apnea   HLD (hyperlipidemia)   Acute respiratory failure with hypoxia (HCC)   NICM (nonischemic cardiomyopathy) (HCC)   Brief Summary: 56 year old male with PMH of morbid obesity, OSA on CPAP, HTN, gout, chronic combined systolic and diastolic CHF, HLD, IDDM, presented to ED with dyspnea and wheezing. EMS noted oxygen saturation of 80%. In the ED, patient was tachycardic, hypertensive and hypoxemic. Chest x-ray revealed vascular congestion consistent with CHF exacerbation. He was briefly on nitroglycerin infusion and BiPAP. After dose of Lasix in the ED, his breathing improved. He was transitioned to nasal cannula oxygen. He was admitted for evaluation and management of acute on chronic combined CHF. Cardiology was consulted.  Assessment and plan:  1. Acute on chronic combined systolic and diastolic CHF: Cardiology consulted and assisted with management. TTE 2016: LVEF 40-45 percent and grade 1 diastolic dysfunction. Briefly on IV NTG in ED and then transitioned to transdermal NTG. Diuresed with IV Lasix. -7.8 L and weight down by 12 pounds since admission. Lasix was reduced and eventually held due to worsening creatinine and need for cardiac cath today.  TTE 11/09/17: LVEF 15%. Sherryll Burgerntresto which was started in the hospital was also stopped on 12/29 due to worsening creatinine. Hydralazine was increased. He underwent cardiac cath today which showed no evidence of CAD and filling pressures are okay. As per communication with Cardiology, resume Lasix 40 mg by mouth daily at home and would wait to restart Entresto at follow-up in cardiology office, cleared for discharge home and they will arrange outpatient follow-up in office in 1 week. 2. Nonischemic cardiomyopathy: Patient underwent cardiac cath due to recent drop in LVEF to exclude high-grade CAD. No evidence of CAD by cath. Continue carvedilol and hydralazine. Entresto discussion as above. 3. Stage III chronic kidney disease: Baseline creatinine not known. Last known creatinine in system is 1.3 on 02/20/11. His renal insufficiency may have progressed since then. He presented with creatinine of 1.64. His new baseline is probably in the 1.6-1.7 range. Close outpatient follow-up with repeat BMP. 4. Acute respiratory failure with hypoxia: Secondary to decompensated CHF. Briefly required BiPAP in ED. Resolved. 5. OSA: Continue nightly CPAP. 6. IDDM: Hemoglobin A1c 7.4 on 12/27. Continue home regimen of insulin and Victoza. Reasonable inpatient control. 7. Essential hypertension: Mildly uncontrolled. Continue carvedilol, hydralazine and Lasix. Close management as outpatient. 8. Hyperlipidemia: Continue statins and omega-3 fatty acids. 9. Morbid obesity/Body mass index is 44.57 kg/m. Needs to lose weight.   Consultations:  Cardiology  Procedures:   RIGHT/LEFT HEART CATH AND CORONARY ANGIOGRAPHY (N/A)  SURGEON:  Surgeon(s) and Role:    * End, Christopher, MD - Primary  FINDINGS: 1. Large, tortuous coronary arteries without angiographically significant coronary artery disease. 2. Normal left and right heart filling pressures. 3. Normal pulmonary artery pressure. 4.  Low normal Fick cardiac  index.  RECOMMENDATIONS: 1. Medical therapy of non-ischemic cardiomyopathy.   TTE 11/09/17: Study Conclusions  - Left ventricle: The cavity size was normal. Systolic function was   normal. The estimated ejection fraction was in the range of 15%   to 20%. Severe diffuse hypokinesis. Regional wall motion   abnormalities cannot be excluded. Doppler parameters are   consistent with a reversible restrictive pattern, indicative of   decreased left ventricular diastolic compliance and/or increased   left atrial pressure (grade 3 diastolic dysfunction). - Mitral valve: There was mild regurgitation. - Left atrium: The atrium was moderately to severely dilated.   Discharge Instructions  Discharge Instructions    (HEART FAILURE PATIENTS) Call MD:  Anytime you have any of the following symptoms: 1) 3 pound weight gain in 24 hours or 5 pounds in 1 week 2) shortness of breath, with or without a dry hacking cough 3) swelling in the hands, feet or stomach 4) if you have to sleep on extra pillows at night in order to breathe.   Complete by:  As directed    Call MD for:  difficulty breathing, headache or visual disturbances   Complete by:  As directed    Call MD for:  extreme fatigue   Complete by:  As directed    Call MD for:  persistant dizziness or light-headedness   Complete by:  As directed    Call MD for:  redness, tenderness, or signs of infection (pain, swelling, redness, odor or green/yellow discharge around incision site)   Complete by:  As directed    Call MD for:  severe uncontrolled pain   Complete by:  As directed    Diet - low sodium heart healthy   Complete by:  As directed    Diet Carb Modified   Complete by:  As directed    Increase activity slowly   Complete by:  As directed        Medication List    TAKE these medications   allopurinol 300 MG tablet Commonly known as:  ZYLOPRIM Take 300 mg by mouth daily.   aspirin EC 81 MG tablet Take 81 mg by mouth daily.    carvedilol 6.25 MG tablet Commonly known as:  COREG Take 6.25 mg by mouth 2 (two) times daily with a meal.   FISH OIL TRIPLE STRENGTH 1400 MG Caps Take 1,400 mg by mouth daily.   furosemide 40 MG tablet Commonly known as:  LASIX Take 40 mg by mouth daily.   gabapentin 300 MG capsule Commonly known as:  NEURONTIN Take 300 mg by mouth at bedtime.   hydrALAZINE 50 MG tablet Commonly known as:  APRESOLINE Take 1 tablet (50 mg total) by mouth 3 (three) times daily.   LEVEMIR FLEXTOUCH 100 UNIT/ML Pen Generic drug:  Insulin Detemir Inject 70 Units into the skin daily.   lovastatin 40 MG tablet Commonly known as:  MEVACOR Take 40 mg by mouth at bedtime.   potassium chloride 10 MEQ tablet Commonly known as:  K-DUR Take 10 mEq by mouth daily.   VICTOZA 18 MG/3ML Sopn Generic drug:  liraglutide Inject 1.8 mg into the skin daily.      Follow-up Information    Ralene Ok, MD. Schedule an appointment as soon as possible for a visit in 1 week(s).   Specialty:  Internal Medicine Why:  To be seen with repeat labs (CBC & BMP). Contact information: [address] [place] [address] [phone]  Barrett, Joline Salt, PA-C Follow up on 11/27/2017.   Specialties:  Cardiology, Radiology Why:  11:30 AM for hospital follow up Contact information: [address] [place] [address] [phone]          No Known Allergies    Procedures/Studies: Dg Chest Port 1 View  Result Date: 11/09/2017 CLINICAL DATA:  Acute onset of shortness of breath and cough. Decreased O2 saturation. Diaphoresis. EXAM: PORTABLE CHEST 1 VIEW COMPARISON:  Chest radiograph performed 03/02/2011 FINDINGS: The lungs are well-aerated. Vascular congestion is noted. Bilateral central airspace opacities raise concern for pulmonary edema. There is no evidence of pleural effusion or pneumothorax. The cardiomediastinal silhouette is within normal limits. No acute osseous abnormalities  are seen. IMPRESSION: Vascular congestion. Bilateral central airspace opacities raise concern for pulmonary edema. Electronically Signed   By: Roanna Raider M.D.   On: 11/09/2017 05:03      Subjective: Patient denies complaints. States that he feels much better. Denies dyspnea, chest pain, orthopnea or PND. No palpitations, dizziness or lightheadedness reported.  Discharge Exam:  Vitals:   11/13/17 1126 11/13/17 1131 11/13/17 1136 11/13/17 1205  BP: (!) 123/91 (!) 127/91 (!) 139/94 126/87  Pulse: (!) 106 (!) 108 (!) 103 67  Resp: (!) 26 17 (!) 24 18  Temp:      TempSrc:      SpO2: (!) 0% 99% 99% 98%  Weight:      Height:        General: Pleasant young male, moderately built and morbidly obese, lying comfortably supine in bed. Cardiovascular: S1 & S2 heard, RRR, S1/S2 +. No murmurs, rubs, gallops or clicks. No JVD or pedal edema. Telemetry personally reviewed: SR-ST in the 110s. Respiratory: Clear to auscultation without wheezing, rhonchi or crackles. No increased work of breathing. Abdominal:  Non distended, non tender & soft. No organomegaly or masses appreciated. Normal bowel sounds heard. CNS: Alert and oriented. No focal deficits. Extremities: no edema, no cyanosis    The results of significant diagnostics from this hospitalization (including imaging, microbiology, ancillary and laboratory) are listed below for reference.       Labs: CBC: Recent Labs  Lab 11/09/17 0454 11/11/17 0434 11/12/17 0445 11/13/17 0517  WBC 7.4 7.7 7.4 8.4  HGB 13.1 13.2 13.2 14.3  HCT 41.3 42.0 41.9 45.1  MCV 96.5 95.7 96.5 97.4  PLT 186 202 215 233   Basic Metabolic Panel: Recent Labs  Lab 11/09/17 0454 11/10/17 0703 11/11/17 0434 11/12/17 0445 11/13/17 0517  NA 135 135 135 136 135  K 4.5 4.1 3.9 4.5 4.0  CL 106 102 102 104 97*  CO2 20* 25 24 25 25   GLUCOSE 313* 189* 188* 154* 154*  BUN 28* 25* 33* 32* 31*  CREATININE 1.64* 1.52* 1.74* 1.73* 1.76*  CALCIUM 8.8* 9.3 9.0  9.0 9.3   Liver Function Tests: Recent Labs  Lab 11/09/17 0454 11/11/17 0434 11/12/17 0445 11/13/17 0517  AST 35 28 30 31   ALT 34 28 29 35  ALKPHOS 75 67 65 76  BILITOT 0.6 0.7 0.6 0.6  PROT 6.6 6.5 6.4* 7.6  ALBUMIN 3.1* 3.1* 3.0* 3.7   BNP (last 3 results) Recent Labs    11/09/17 0454  BNP 48.5   CBG: Recent Labs  Lab 11/12/17 1143 11/12/17 1631 11/12/17 2014 11/13/17 0745 11/13/17 1210  GLUCAP 169* 86 164* 134* 118*     Time coordinating discharge: Over 30 minutes  SIGNED:  Marcellus Scott, MD, FACP, [facility]. [facility] Pager [phone]  [phone]  If 7PM-7AM, please contact night-coverage [email or web address] Password [email or web address] 11/13/2017, 2:05 PM

## 2017-11-13 NOTE — Progress Notes (Signed)
TR band removed. 2x2 gauze and Tegaderm dressing placed. Site care education given and pt verbalizes understanding

## 2017-11-13 NOTE — Brief Op Note (Signed)
BRIEF CARDIAC CATHETERIZATION NOTE  DATE: 11/13/2017 TIME: 11:44 AM  PATIENT:  Miguel Ho  56 y.o. male  PRE-OPERATIVE DIAGNOSIS:  Acute on chronic systolic and diastolic heart failure  POST-OPERATIVE DIAGNOSIS:  Same  PROCEDURE:  Procedure(s): RIGHT/LEFT HEART CATH AND CORONARY ANGIOGRAPHY (N/A)  SURGEON:  Surgeon(s) and Role:    * Dionisio Aragones, MD - Primary  FINDINGS: 1. Large, tortuous coronary arteries without angiographically significant coronary artery disease. 2. Normal left and right heart filling pressures. 3. Normal pulmonary artery pressure. 4. Low normal Fick cardiac index.  RECOMMENDATIONS: 1. Medical therapy of non-ischemic cardiomyopathy.  [name], MD [facility] Pager: [phone]

## 2017-11-13 NOTE — H&P (View-Only) (Signed)
Progress Note  Patient Name: Miguel Ho Date of Encounter: 11/13/2017  Primary Cardiologist: Duke Salviaandolph  Subjective   No chest pain or dyspnea.   Inpatient Medications    Scheduled Meds: . allopurinol  300 mg Oral Daily  . aspirin EC  81 mg Oral Daily  . carvedilol  6.25 mg Oral BID WC  . enoxaparin (LOVENOX) injection  30 mg Subcutaneous Q24H  . gabapentin  300 mg Oral QHS  . hydrALAZINE  50 mg Oral Q8H  . insulin aspart  0-5 Units Subcutaneous QHS  . insulin aspart  0-9 Units Subcutaneous TID WC  . insulin detemir  70 Units Subcutaneous Daily  . liraglutide  1.8 mg Subcutaneous Daily  . nitroGLYCERIN  0.5 inch Topical Q6H  . omega-3 acid ethyl esters  1 g Oral BID  . potassium chloride  20 mEq Oral BID  . pravastatin  40 mg Oral q1800  . sodium chloride flush  3 mL Intravenous Q12H  . sodium chloride flush  3 mL Intravenous Q12H  . sodium chloride flush  3 mL Intravenous Q12H   Continuous Infusions: . sodium chloride    . sodium chloride    . sodium chloride 40 mL/hr at 11/13/17 0635  . sodium chloride    . sodium chloride     PRN Meds: sodium chloride, sodium chloride, sodium chloride, acetaminophen, ondansetron (ZOFRAN) IV, sodium chloride flush, sodium chloride flush, sodium chloride flush   Vital Signs    Vitals:   11/12/17 0558 11/12/17 1224 11/12/17 1945 11/13/17 0511  BP: (!) 137/97 126/85 (!) 139/96 109/76  Pulse: (!) 107 (!) 109 (!) 115 (!) 103  Resp: 18 18 18 18   Temp: (!) 97 F (36.1 C) 97.6 F (36.4 C) 97.9 F (36.6 C) 98.2 F (36.8 C)  TempSrc: Oral Oral Oral Oral  SpO2: 95% 97% 99% 99%  Weight: (!) 300 lb 3.2 oz (136.2 kg)   (!) 301 lb 12.8 oz (136.9 kg)  Height:        Intake/Output Summary (Last 24 hours) at 11/13/2017 0927 Last data filed at 11/12/2017 1928 Gross per 24 hour  Intake 1080 ml  Output 1400 ml  Net -320 ml   Filed Weights   11/11/17 0612 11/12/17 0558 11/13/17 0511  Weight: 299 lb 3.2 oz (135.7 kg) (!) 300  lb 3.2 oz (136.2 kg) (!) 301 lb 12.8 oz (136.9 kg)    Telemetry    Sinus- Personally Reviewed  ECG     Physical Exam   GEN: No acute distress.   Neck: No JVD Cardiac: RRR, no murmurs, rubs, or gallops.  Respiratory: Clear to auscultation bilaterally. GI: Soft, nontender, non-distended  MS: No edema; No deformity. Neuro:  Nonfocal  Psych: Normal affect   Labs    Chemistry Recent Labs  Lab 11/11/17 0434 11/12/17 0445 11/13/17 0517  NA 135 136 135  K 3.9 4.5 4.0  CL 102 104 97*  CO2 24 25 25   GLUCOSE 188* 154* 154*  BUN 33* 32* 31*  CREATININE 1.74* 1.73* 1.76*  CALCIUM 9.0 9.0 9.3  PROT 6.5 6.4* 7.6  ALBUMIN 3.1* 3.0* 3.7  AST 28 30 31   ALT 28 29 35  ALKPHOS 67 65 76  BILITOT 0.7 0.6 0.6  GFRNONAA 42* 42* 41*  GFRAA 48* 49* 48*  ANIONGAP 9 7 13      Hematology Recent Labs  Lab 11/11/17 0434 11/12/17 0445 11/13/17 0517  WBC 7.7 7.4 8.4  RBC 4.39 4.34 4.63  HGB  13.2 13.2 14.3  HCT 42.0 41.9 45.1  MCV 95.7 96.5 97.4  MCH 30.1 30.4 30.9  MCHC 31.4 31.5 31.7  RDW 14.0 14.1 14.4  PLT 202 215 233    Cardiac EnzymesNo results for input(s): TROPONINI in the last 168 hours.  Recent Labs  Lab 11/09/17 0514  TROPIPOC 0.02     BNP Recent Labs  Lab 11/09/17 0454  BNP 48.5     DDimer No results for input(s): DDIMER in the last 168 hours.   Radiology    No results found.  Cardiac Studies   Echo 11/09/17: - Left ventricle: The cavity size was normal. Systolic function was   normal. The estimated ejection fraction was in the range of 15%   to 20%. Severe diffuse hypokinesis. Regional wall motion   abnormalities cannot be excluded. Doppler parameters are   consistent with a reversible restrictive pattern, indicative of   decreased left ventricular diastolic compliance and/or increased   left atrial pressure (grade 3 diastolic dysfunction). - Mitral valve: There was mild regurgitation. - Left atrium: The atrium was moderately to severely  dilated.  Patient Profile     56 y.o. male with history of chronic systolic/diastolic CHF, NICM, HTN, DM, OSA, obesity admitted with acute CHF and found to have worsened LV systolic function.   Assessment & Plan     1. Acute on chronic systolic/diastolic CHF: He has diuresed well with net negative 7.8 liters since admission. Lasix on hold today for cath. Will resume po Lasix post cath.    2. Cardiomyopathy: He has been felt to have a non-ischemic cardiomyopathy. With recent drop in LVEF, plans in place today for cardiac cath to exclude high grade CAD. Continue Coreg. Entresto on hold prior to cath.   3. CKD: Renal function stable this am. I suspect that his new baseline is around 1.6 to 1.7.   For questions or updates, please contact [facility] Please consult [email or web address] for contact info under Cardiology/STEMI.      Signed, Verne Carrow, MD  11/13/2017, 9:27 AM

## 2017-11-13 NOTE — Telephone Encounter (Signed)
TCM with Barrett, Joline Salt, PA-C on 11/27/2017 at 11:30 AM

## 2017-11-15 ENCOUNTER — Encounter (HOSPITAL_COMMUNITY): Payer: Self-pay | Admitting: Internal Medicine

## 2017-11-15 NOTE — Telephone Encounter (Signed)
Left a message to call back.

## 2017-11-16 NOTE — Telephone Encounter (Signed)
Patient contacted regarding discharge from [facility] on 11/13/17.  Patient understands to follow up with provider R. BARRETTon 1/14/19at 11:30 AM at [facility]. Patient understands discharge instructions? yes  Patient understands medications and regiment? yes  Patient understands to bring all medications to this visit? yes     Patient had a question whether or not to continue with metformin twice a day , patient states he forget to ask to continue or not. RN  Informed patient to continue with medications.  Direction on how to get to office for appointment given. Patient verbalized understanding.Marland Kitchen

## 2017-11-17 ENCOUNTER — Other Ambulatory Visit: Payer: Self-pay

## 2017-11-17 ENCOUNTER — Encounter (HOSPITAL_COMMUNITY): Payer: Self-pay | Admitting: *Deleted

## 2017-11-17 ENCOUNTER — Emergency Department (HOSPITAL_COMMUNITY): Payer: Managed Care, Other (non HMO)

## 2017-11-17 ENCOUNTER — Emergency Department (HOSPITAL_COMMUNITY)
Admission: EM | Admit: 2017-11-17 | Discharge: 2017-11-17 | Disposition: A | Discharge status: Home / Self Care | Payer: Managed Care, Other (non HMO) | Attending: Emergency Medicine | Admitting: Emergency Medicine

## 2017-11-17 DIAGNOSIS — Z794 Long term (current) use of insulin: Secondary | ICD-10-CM | POA: Diagnosis not present

## 2017-11-17 DIAGNOSIS — E119 Type 2 diabetes mellitus without complications: Secondary | ICD-10-CM | POA: Insufficient documentation

## 2017-11-17 DIAGNOSIS — I110 Hypertensive heart disease with heart failure: Secondary | ICD-10-CM | POA: Diagnosis not present

## 2017-11-17 DIAGNOSIS — Z7982 Long term (current) use of aspirin: Secondary | ICD-10-CM | POA: Diagnosis not present

## 2017-11-17 DIAGNOSIS — R079 Chest pain, unspecified: Secondary | ICD-10-CM | POA: Diagnosis present

## 2017-11-17 DIAGNOSIS — R0789 Other chest pain: Secondary | ICD-10-CM | POA: Diagnosis not present

## 2017-11-17 DIAGNOSIS — Z79899 Other long term (current) drug therapy: Secondary | ICD-10-CM | POA: Diagnosis not present

## 2017-11-17 DIAGNOSIS — I5042 Chronic combined systolic (congestive) and diastolic (congestive) heart failure: Secondary | ICD-10-CM | POA: Insufficient documentation

## 2017-11-17 LAB — I-STAT CHEM 8, ED
BUN: 44 mg/dL — ABNORMAL HIGH (ref 6–20)
Calcium, Ion: 1.2 mmol/L (ref 1.15–1.40)
Chloride: 105 mmol/L (ref 101–111)
Creatinine, Ser: 2 mg/dL — ABNORMAL HIGH (ref 0.61–1.24)
GLUCOSE: 124 mg/dL — AB (ref 65–99)
HCT: 46 % (ref 39.0–52.0)
HEMOGLOBIN: 15.6 g/dL (ref 13.0–17.0)
POTASSIUM: 4.3 mmol/L (ref 3.5–5.1)
SODIUM: 140 mmol/L (ref 135–145)
TCO2: 26 mmol/L (ref 22–32)

## 2017-11-17 LAB — I-STAT TROPONIN, ED: TROPONIN I, POC: 0.02 ng/mL (ref 0.00–0.08)

## 2017-11-17 LAB — TROPONIN I

## 2017-11-17 NOTE — ED Provider Notes (Signed)
[facility] Provider Note   CSN: [id number] Arrival date & time: 11/17/17  4098     History   Chief Complaint Chief Complaint  Patient presents with  . Chest Pain  . Shortness of Breath    HPI [facility] is a 57 y.o. male.  Pt presents to the ED today with cp and sob.  The pt said he was driving in his car and developed left sided cp.  He feels much better now.  The pt said he got up early and did have a cup of coffee which he does not usually drink.  The pt was admitted from 12/27-31 for chf and cp.  He had a cath on 12/31 which showed:  RIGHT/LEFT HEART CATH AND CORONARY ANGIOGRAPHY Conclusion   Conclusions: 1. Large, tortuous coronary arteries without angiographically significant coronary artery disease. 2. Normal left and right heart filling pressures. 3. Normal pulmonary artery pressure. 4. Low normal Fick cardiac index.  Recommendations: 1. Medical therapy for non-ischemic cardiomyopathy.        Past Medical History:  Diagnosis Date  . Childhood asthma   . Chronic combined systolic and diastolic heart failure (HCC) 10/12/2015  . Chronic systolic heart failure (HCC) 10/12/2015  . Diabetic peripheral neuropathy (HCC)   . Essential hypertension 10/12/2015  . GERD (gastroesophageal reflux disease)   . Gout    "on daily RX" (11/09/2017)  . Hyperlipidemia 10/12/2015  . Hypertension   . Morbid obesity (HCC) 10/12/2015  . OSA on CPAP   . Type II diabetes mellitus [facility])     Patient Active Problem List   Diagnosis Date Noted  . NICM (nonischemic cardiomyopathy) (HCC)   . Acute kidney injury (HCC) 11/09/2017  . Insulin dependent type 2 diabetes mellitus, uncontrolled (HCC) 11/09/2017  . Hypertension 11/09/2017  . History of sleep apnea 11/09/2017  . HLD (hyperlipidemia) 11/09/2017  . Acute on chronic combined systolic and diastolic heart failure (HCC) 11/09/2017  . Acute respiratory failure with hypoxia (HCC)  11/09/2017  . Essential hypertension 10/12/2015  . Hyperlipidemia 10/12/2015  . Morbid obesity (HCC) 10/12/2015  . Chronic combined systolic and diastolic heart failure (HCC) 10/12/2015  . Diabetes mellitus (HCC) 10/12/2015    Past Surgical History:  Procedure Laterality Date  . CARDIAC CATHETERIZATION  2003   nl cors, Dr Elsie Lincoln  . RIGHT/LEFT HEART CATH AND CORONARY ANGIOGRAPHY N/A 11/13/2017   Procedure: RIGHT/LEFT HEART CATH AND CORONARY ANGIOGRAPHY;  Surgeon: [name], MD;  Location: [facility] INVASIVE CV LAB;  Service: Cardiovascular;  Laterality: N/A;  . TONSILLECTOMY         Home Medications    Prior to Admission medications   Medication Sig Start Date End Date Taking? Authorizing Provider  allopurinol (ZYLOPRIM) 300 MG tablet Take 300 mg by mouth daily.     Yes [provider]  aspirin EC 81 MG tablet Take 81 mg by mouth daily.     Yes [provider]  carvedilol (COREG) 6.25 MG tablet Take 6.25 mg by mouth 2 (two) times daily with a meal.     Yes [provider]  furosemide (LASIX) 40 MG tablet Take 40 mg by mouth daily.     Yes [provider]  gabapentin (NEURONTIN) 300 MG capsule Take 300 mg by mouth at bedtime. 09/14/17  Yes [provider]  hydrALAZINE (APRESOLINE) 50 MG tablet Take 1 tablet (50 mg total) by mouth 3 (three) times daily. 11/13/17  Yes Hongalgi, Maximino Greenland, MD  LEVEMIR  FLEXTOUCH 100 UNIT/ML Pen Inject 70 Units into the skin daily.  03/28/16  Yes [provider]  lovastatin (MEVACOR) 40 MG tablet Take 40 mg by mouth at bedtime.     Yes [provider]  metFORMIN (GLUCOPHAGE) 500 MG tablet Take 500 mg by mouth 2 (two) times daily with a meal.   Yes [provider]  Omega-3 Fatty Acids (FISH OIL TRIPLE STRENGTH) 1400 MG CAPS Take 1,400 mg by mouth daily.     Yes [provider]  potassium chloride SA (K-DUR,KLOR-CON) 20 MEQ tablet Take 20 mEq by mouth daily.    Yes [provider]  VICTOZA 18 MG/3ML SOPN Inject 1.8 mg into the skin daily. 03/21/16  Yes [provider]    Family History Family History  Problem Relation Age of Onset  . Heart attack Father        deceased age 72  . Hypertension Father   . Diabetes Father   . Heart disease Father   . Cancer Sister        leukemia  . Heart attack Mother        deceased age 2  . Heart attack Paternal Grandmother     Social History Social History   Tobacco Use  . Smoking status: Never Smoker  . Smokeless tobacco: Never Used  Substance Use Topics  . Alcohol use: Yes    Comment: 11/09/2017 "on major holidays I might have a drink; last drink was 02/2017"  . Drug use: No     Allergies   Patient has no known allergies.   Review of Systems Review of Systems  Respiratory: Positive for shortness of breath.   Cardiovascular: Positive for chest pain.  All other systems reviewed and are negative.    Physical Exam Updated Vital Signs BP (!) 134/99   Pulse 95   Temp 98.5 F (36.9 C) (Oral)   Resp 20   SpO2 95%   Physical Exam  Constitutional: He is oriented to person, place, and time. He appears well-developed and well-nourished.  HENT:  Head: Normocephalic and atraumatic.  Eyes: EOM are normal. Pupils are equal, round, and reactive to light.  Neck: Normal range of motion. Neck supple.  Cardiovascular: Normal rate, regular rhythm, intact distal pulses and normal pulses.  Pulmonary/Chest: Effort normal and breath sounds normal.  Abdominal: Soft. Bowel sounds are normal.  Musculoskeletal: Normal range of motion.       Right lower leg: Normal.       Left lower leg: Normal.  Neurological: He is alert and oriented to person, place, and time.  Skin: Skin is warm and dry. Capillary refill takes less than 2 seconds.  Psychiatric: He has a normal mood and affect. His behavior is normal.  Nursing note and vitals reviewed.    ED Treatments / Results  Labs (all labs ordered are  listed, but only abnormal results are displayed) Labs Reviewed  I-STAT CHEM 8, ED - Abnormal; Notable for the following components:      Result Value   BUN 44 (*)    Creatinine, Ser 2.00 (*)    Glucose, Bld 124 (*)    All other components within normal limits  TROPONIN I  I-STAT TROPONIN, ED    EKG  EKG Interpretation  Date/Time:  Friday November 17 2017 10:08:57 EST Ventricular Rate:  98 PR Interval:  200 QRS Duration: 88 QT Interval:  348 QTC Calculation: 444 R Axis:   99 Text Interpretation:  Normal sinus rhythm  Rightward axis Borderline ECG No significant change since last tracing Confirmed by Jacalyn Lefevre [phone]) on 11/17/2017 11:50:13 AM       Radiology Dg Chest 2 View  Result Date: 11/17/2017 CLINICAL DATA:  Left side chest tightness, shortness of Breath EXAM: CHEST  2 VIEW COMPARISON:  11/09/2017 FINDINGS: Heart is upper limits normal in size. No confluent opacities or effusions. No acute bony abnormality. IMPRESSION: No active cardiopulmonary disease. Electronically Signed   By: Charlett Nose M.D.   On: 11/17/2017 11:14    Procedures Procedures (including critical care time)  Medications Ordered in ED Medications - No data to display   Initial Impression / Assessment and Plan / ED Course  I have reviewed the triage vital signs and the nursing notes.  Pertinent labs & imaging results that were available during my care of the patient were reviewed by me and considered in my medical decision making (see chart for details).    Pt's cp has resolved.  He knows to avoid coffee and to return if worse.  Final Clinical Impressions(s) / ED Diagnoses   Final diagnoses:  Atypical chest pain    ED Discharge Orders    None       Jacalyn Lefevre, MD 11/17/17 1346

## 2017-11-17 NOTE — ED Notes (Signed)
ED Provider at bedside. 

## 2017-11-17 NOTE — ED Triage Notes (Signed)
Pt reports onset this am of left side chest tightness and sob. ekg done at triage. Reports hx of chf. Airway intact at triage.

## 2017-11-17 NOTE — Discharge Instructions (Signed)
Avoid caffeine.  Return if worse. °

## 2017-11-27 ENCOUNTER — Encounter: Payer: Self-pay | Admitting: Physician Assistant

## 2017-11-27 ENCOUNTER — Ambulatory Visit (INDEPENDENT_AMBULATORY_CARE_PROVIDER_SITE_OTHER): Payer: Managed Care, Other (non HMO) | Admitting: Physician Assistant

## 2017-11-27 VITALS — BP 130/86 | HR 100 | Ht 69.0 in | Wt 306.8 lb

## 2017-11-27 DIAGNOSIS — N179 Acute kidney failure, unspecified: Secondary | ICD-10-CM

## 2017-11-27 DIAGNOSIS — I10 Essential (primary) hypertension: Secondary | ICD-10-CM | POA: Diagnosis not present

## 2017-11-27 DIAGNOSIS — I5042 Chronic combined systolic (congestive) and diastolic (congestive) heart failure: Secondary | ICD-10-CM | POA: Diagnosis not present

## 2017-11-27 MED ORDER — CARVEDILOL 12.5 MG PO TABS: 12.5000 mg | ORAL_TABLET | Freq: Two times a day (BID) | ORAL | 3 refills | Status: DC

## 2017-11-27 NOTE — Patient Instructions (Addendum)
MEDICATIONS INSTRUCTIONS  -- CARVEDILOL 6.25 MG INCREASE- YOU MAY TAKE 2 TABLETS TWICE A DAY UNTIL BOTTLE IS COMPLETED THEN STOP.   --START CARVEDILOL 12.5 MG ( ONE TABLET) TWICE A DAY .   DIETARY INSTRUCTIONS  CONTINUE WITH 2,000 MG SODIUM/SALT INTAKE DAILY OR 500 MG SODIUM/SALT PER MEAL.  1 AND 1/2 QUART OF LIQUID/WATER A DAY ONLY    Your physician recommends that you weigh, daily, at the same time  MORNING every day, and in the same amount of clothing. Please record your daily weights on the handout provided and bring it to your next appointment.  OKAY TO EXERCISE  Your physician recommends that you schedule a follow-up appointment in 1 MONTHS WITH RHONDA OR DR [facility]  If you need a refill on your cardiac medications before your next appointment, please call your pharmacy.     Two Gram Sodium Diet 2000 mg  What is Sodium? Sodium is a mineral found naturally in many foods. The most significant source of sodium in the diet is table salt, which is about 40% sodium.  Processed, convenience, and preserved foods also contain a large amount of sodium.  The body needs only 500 mg of sodium daily to function,  A normal diet provides more than enough sodium even if you do not use salt.  Why Limit Sodium? A build up of sodium in the body can cause thirst, increased blood pressure, shortness of breath, and water retention.  Decreasing sodium in the diet can reduce edema and risk of heart attack or stroke associated with high blood pressure.  Keep in mind that there are many other factors involved in these health problems.  Heredity, obesity, lack of exercise, cigarette smoking, stress and what you eat all play a role.  General Guidelines:  Do not add salt at the table or in cooking.  One teaspoon of salt contains over 2 grams of sodium.  Read food labels  Avoid processed and convenience foods  Ask your dietitian before eating any foods not dicussed in the menu planning  guidelines  Consult your physician if you wish to use a salt substitute or a sodium containing medication such as antacids.  Limit milk and milk products to 16 oz (2 cups) per day.  Shopping Hints:  READ LABELS!! "Dietetic" does not necessarily mean low sodium.  Salt and other sodium ingredients are often added to foods during processing.   Menu Planning Guidelines Food Group Choose More Often Avoid  Beverages (see also the milk group All fruit juices, low-sodium, salt-free vegetables juices, low-sodium carbonated beverages Regular vegetable or tomato juices, commercially softened water used for drinking or cooking  Breads and Cereals Enriched white, wheat, rye and pumpernickel bread, hard rolls and dinner rolls; muffins, cornbread and waffles; most dry cereals, cooked cereal without added salt; unsalted crackers and breadsticks; low sodium or homemade bread crumbs Bread, rolls and crackers with salted tops; quick breads; instant hot cereals; pancakes; commercial bread stuffing; self-rising flower and biscuit mixes; regular bread crumbs or cracker crumbs  Desserts and Sweets Desserts and sweets mad with mild should be within allowance Instant pudding mixes and cake mixes  Fats Butter or margarine; vegetable oils; unsalted salad dressings, regular salad dressings limited to 1 Tbs; light, sour and heavy cream Regular salad dressings containing bacon fat, bacon bits, and salt pork; snack dips made with instant soup mixes or processed cheese; salted nuts  Fruits Most fresh, frozen and canned fruits Fruits processed with salt or sodium-containing ingredient (some dried  fruits are processed with sodium sulfites        Vegetables Fresh, frozen vegetables and low- sodium canned vegetables Regular canned vegetables, sauerkraut, pickled vegetables, and others prepared in brine; frozen vegetables in sauces; vegetables seasoned with ham, bacon or salt pork  Condiments, Sauces, Miscellaneous  Salt  substitute with physician's approval; pepper, herbs, spices; vinegar, lemon or lime juice; hot pepper sauce; garlic powder, onion powder, low sodium soy sauce (1 Tbs.); low sodium condiments (ketchup, chili sauce, mustard) in limited amounts (1 tsp.) fresh ground horseradish; unsalted tortilla chips, pretzels, potato chips, popcorn, salsa (1/4 cup) Any seasoning made with salt including garlic salt, celery salt, onion salt, and seasoned salt; sea salt, rock salt, kosher salt; meat tenderizers; monosodium glutamate; mustard, regular soy sauce, barbecue, sauce, chili sauce, teriyaki sauce, steak sauce, Worcestershire sauce, and most flavored vinegars; canned gravy and mixes; regular condiments; salted snack foods, olives, picles, relish, horseradish sauce, catsup   Food preparation: Try these seasonings Meats:    Pork Sage, onion Serve with applesauce  Chicken Poultry seasoning, thyme, parsley Serve with cranberry sauce  Lamb Curry powder, rosemary, garlic, thyme Serve with mint sauce or jelly  Veal Marjoram, basil Serve with current jelly, cranberry sauce  Beef Pepper, bay leaf Serve with dry mustard, unsalted chive butter  Fish [place] leaf, dill Serve with unsalted lemon butter, unsalted parsley butter  Vegetables:    Asparagus Lemon juice   Broccoli Lemon juice   Carrots Mustard dressing parsley, mint, nutmeg, glazed with unsalted butter and sugar   Green beans Marjoram, lemon juice, nutmeg,dill seed   Tomatoes Basil, marjoram, onion   Spice /blend for [employer]" 4 tsp ground thyme 1 tsp ground sage 3 tsp ground rosemary 4 tsp ground marjoram   Test your knowledge 1. A product that says "Salt Free" may still contain sodium. True or False 2. Garlic Powder and Hot Pepper Sauce an be used as alternative seasonings.True or False 3. Processed foods have more sodium than fresh foods.  True or False 4. Canned Vegetables have less sodium than froze True or False  WAYS TO DECREASE YOUR SODIUM  INTAKE 1. Avoid the use of added salt in cooking and at the table.  Table salt (and other prepared seasonings which contain salt) is probably one of the greatest sources of sodium in the diet.  Unsalted foods can gain flavor from the sweet, sour, and butter taste sensations of herbs and spices.  Instead of using salt for seasoning, try the following seasonings with the foods listed.  Remember: how you use them to enhance natural food flavors is limited only by your creativity... Allspice-Meat, fish, eggs, fruit, peas, red and yellow vegetables Almond Extract-Fruit baked goods Anise Seed-Sweet breads, fruit, carrots, beets, cottage cheese, cookies (tastes like licorice) Basil-Meat, fish, eggs, vegetables, rice, vegetables salads, soups, sauces Bay Leaf-Meat, fish, stews, poultry Burnet-Salad, vegetables (cucumber-like flavor) Caraway Seed-Bread, cookies, cottage cheese, meat, vegetables, cheese, rice Cardamon-Baked goods, fruit, soups Celery Powder or seed-Salads, salad dressings, sauces, meatloaf, soup, bread.Do not use  celery salt Chervil-Meats, salads, fish, eggs, vegetables, cottage cheese (parsley-like flavor) Chili Power-Meatloaf, chicken cheese, corn, eggplant, egg dishes Chives-Salads cottage cheese, egg dishes, soups, vegetables, sauces Cilantro-Salsa, casseroles Cinnamon-Baked goods, fruit, pork, lamb, chicken, carrots Cloves-Fruit, baked goods, fish, pot roast, green beans, beets, carrots Coriander-Pastry, cookies, meat, salads, cheese (lemon-orange flavor) Cumin-Meatloaf, fish,cheese, eggs, cabbage,fruit pie (caraway flavor) [employer], fruit, eggs, fish, poultry, cottage cheese, vegetables Dill Seed-Meat, cottage cheese, poultry, vegetables, fish, salads, bread Fennel Seed-Bread,  cookies, apples, pork, eggs, fish, beets, cabbage, cheese, Licorice-like flavor Garlic-(buds or powder) Salads, meat, poultry, fish, bread, butter, vegetables, potatoes.Do not  use garlic  salt Ginger-Fruit, vegetables, baked goods, meat, fish, poultry Horseradish Root-Meet, vegetables, butter Lemon Juice or Extract-Vegetables, fruit, tea, baked goods, fish salads Mace-Baked goods fruit, vegetables, fish, poultry (taste like nutmeg) Maple Extract-Syrups Marjoram-Meat, chicken, fish, vegetables, breads, green salads (taste like Sage) Mint-Tea, lamb, sherbet, vegetables, desserts, carrots, cabbage Mustard, Dry or Seed-Cheese, eggs, meats, vegetables, poultry Nutmeg-Baked goods, fruit, chicken, eggs, vegetables, desserts Onion Powder-Meat, fish, poultry, vegetables, cheese, eggs, bread, rice salads (Do not use   Onion salt) Orange Extract-Desserts, baked goods Oregano-Pasta, eggs, cheese, onions, pork, lamb, fish, chicken, vegetables, green salads Paprika-Meat, fish, poultry, eggs, cheese, vegetables Parsley Flakes-Butter, vegetables, meat fish, poultry, eggs, bread, salads (certain forms may   Contain sodium Pepper-Meat fish, poultry, vegetables, eggs Peppermint Extract-Desserts, baked goods Poppy Seed-Eggs, bread, cheese, fruit dressings, baked goods, noodles, vegetables, cottage  [employer], poultry, meat, fish, cauliflower, turnips,eggs bread Saffron-Rice, bread, veal, chicken, fish, eggs Sage-Meat, fish, poultry, onions, eggplant, tomateos, pork, stews Savory-Eggs, salads, poultry, meat, rice, vegetables, soups, pork Tarragon-Meat, poultry, fish, eggs, butter, vegetables (licorice-like flavor)  Thyme-Meat, poultry, fish, eggs, vegetables, (clover-like flavor), sauces, soups Tumeric-Salads, butter, eggs, fish, rice, vegetables (saffron-like flavor) Vanilla Extract-Baked goods, candy Vinegar-Salads, vegetables, meat marinades Walnut Extract-baked goods, candy  2. Choose your Foods Wisely   The following is a list of foods to avoid which are high in sodium:  Meats-Avoid all smoked, canned, salt cured, dried and kosher meat and  fish as well as Anchovies   [employer] meats:Bologna, Liverwurst, Pastrami Canned meat or fish  Marinated herring Caviar    Pepperoni Corned Beef   Pizza Dried chipped beef  Salami Frozen breaded fish or meat Salt pork Frankfurters or hot dogs  Sardines Gefilte fish   Sausage Ham (boiled ham, Proscuitto Smoked butt    spiced ham)   Spam      TV Dinners Vegetables Canned vegetables (Regular) Relish Canned mushrooms  Sauerkraut Olives    Tomato juice Pickles  Bakery and Dessert Products Canned puddings  Cream pies Cheesecake   Decorated cakes Cookies  Beverages/Juices Tomato juice, regular  Gatorade   V-8 vegetable juice, regular  Breads and Cereals Biscuit mixes   Salted potato chips, corn chips, pretzels Bread stuffing mixes  Salted crackers and rolls Pancake and waffle mixes Self-rising flour  Seasonings Accent    Meat sauces Barbecue sauce  Meat tenderizer Catsup    Monosodium glutamate (MSG) Celery salt   Onion salt Chili sauce   Prepared mustard Garlic salt   Salt, seasoned salt, sea salt Gravy mixes   Soy sauce Horseradish   Steak sauce Ketchup   Tartar sauce Lite salt    Teriyaki sauce Marinade mixes   Worcestershire sauce  Others Baking powder   Cocoa and cocoa mixes Baking soda   Commercial casserole mixes Candy-caramels, chocolate  Dehydrated soups    Bars, fudge,nougats  Instant rice and pasta mixes Canned broth or soup  Maraschino cherries Cheese, aged and processed cheese and cheese spreads  Learning Assessment Quiz  Indicated T (for True) or F (for False) for each of the following statements:  1. _____ Fresh fruits and vegetables and unprocessed grains are generally low in sodium 2. _____ Water may contain a considerable amount of sodium, depending on the source 3. _____ You can always tell if a food is high in sodium  by tasting it 4. _____ Certain laxatives my be high in sodium and should be avoided unless prescribed   by a  physician or pharmacist 5. _____ Salt substitutes may be used freely by anyone on a sodium restricted diet 6. _____ Sodium is present in table salt, food additives and as a natural component of   most foods 7. _____ Table salt is approximately 90% sodium 8. _____ Limiting sodium intake may help prevent excess fluid accumulation in the body 9. _____ On a sodium-restricted diet, seasonings such as bouillon soy sauce, and    cooking wine should be used in place of table salt 10. _____ On an ingredient list, a product which lists monosodium glutamate as the first   ingredient is an appropriate food to include on a low sodium diet  Circle the best answer(s) to the following statements (Hint: there may be more than one correct answer)  11. On a low-sodium diet, some acceptable snack items are:    A. Olives  F. Bean dip   K. Grapefruit juice    B. Salted Pretzels G. Commercial Popcorn   L. Canned peaches    C. Carrot Sticks  H. Bouillon   M. Unsalted nuts   D. [place] fries  I. Peanut butter crackers N. Salami   E. Sweet pickles J. Tomato Juice   O. Pizza  12.  Seasonings that may be used freely on a reduced - sodium diet include   A. Lemon wedges F.Monosodium glutamate K. Celery seed    B.Soysauce   G. Pepper   L. Mustard powder   C. Sea salt  H. Cooking wine  M. Onion flakes   D. Vinegar  E. Prepared horseradish N. Salsa   E. Sage   J. Worcestershire sauce  O. Chutney

## 2017-11-27 NOTE — Progress Notes (Signed)
Cardiology Office Note   Date:  11/27/2017   ID:  Miguel Ho, DOB [birth year], MRN [id number]  PCP:  Ralene Ok, MD  Cardiologist: Dr. Duke Salvia, 04/05/2016 Theodore Demark, PA-C   Chief Complaint  Patient presents with  . Follow-up    History of Present Illness: Miguel Ho is a 57 y.o. male with a history of S-D-CHF, DM, HTN, HLD, OSA on CPAP, morbid obesity, gout, GERD, CKD III  Admitted 12/27-12/31/2018 for CHF req BiPAP, diuresed but Cr increased>>Lasix held, cath w/ no sig CAD, EF 15% echo, d/c on Lasix 40 mg qd, add Entresto as outpt (but pt never on ARB), Cr 1.76 and wt 301 at d/c. Previously on lisinopril, not on at admission. 11/17/2017 ER visit for chest pain and shortness of breath, Cr 2.0, dx atypical CP.  Miguel Ho presents for cardiology follow up.  Since ER visit 01/04, he has had no CP or SOB. He is exercising without DOE. He no longer has orthopnea or PND. No LE edema.  He is watching the sodium carefully in his diet. He is weighing daily but not always in the morning. Sometimes he drinks more than 1-1/2 liters, but not often. He drinks mostly water, all-natural juices. His weight was up this am.  He does not know why, does not think he had any recent dietary indiscretion.  No presyncope or syncope.  No palpitations.  He has neuropathy in his feet, this bothers him.  However, he still feels that he can increase his activity.  He is going slowly and does not get too out of breath when he is on the treadmill or the bike.   Past Medical History:  Diagnosis Date  . Childhood asthma   . Chronic combined systolic and diastolic heart failure (HCC) 10/12/2015  . Diabetic peripheral neuropathy (HCC)   . Essential hypertension 10/12/2015  . GERD (gastroesophageal reflux disease)   . Gout    "on daily RX" (11/09/2017)  . Hyperlipidemia 10/12/2015  . Morbid obesity (HCC) 10/12/2015  . OSA on CPAP   . Type II diabetes mellitus (HCC)     Past  Surgical History:  Procedure Laterality Date  . CARDIAC CATHETERIZATION  2003   nl cors, Dr Elsie Lincoln  . RIGHT/LEFT HEART CATH AND CORONARY ANGIOGRAPHY N/A 11/13/2017   Procedure: RIGHT/LEFT HEART CATH AND CORONARY ANGIOGRAPHY;  Surgeon: [name], MD;  Location: [facility];  Service: Cardiovascular;  Laterality: N/A;  . TONSILLECTOMY      Current Outpatient Medications  Medication Sig Dispense Refill  . allopurinol (ZYLOPRIM) 300 MG tablet Take 300 mg by mouth daily.      Marland Kitchen aspirin EC 81 MG tablet Take 81 mg by mouth daily.      . furosemide (LASIX) 40 MG tablet Take 40 mg by mouth daily.      Marland Kitchen gabapentin (NEURONTIN) 300 MG capsule Take 300 mg by mouth at bedtime.  1  . hydrALAZINE (APRESOLINE) 50 MG tablet Take 1 tablet (50 mg total) by mouth 3 (three) times daily. 90 tablet 0  . LEVEMIR FLEXTOUCH 100 UNIT/ML Pen Inject 70 Units into the skin daily.   4  . lovastatin (MEVACOR) 40 MG tablet Take 40 mg by mouth at bedtime.      . metFORMIN (GLUCOPHAGE) 500 MG tablet Take 500 mg by mouth 2 (two) times daily with a meal.    . Omega-3 Fatty Acids (FISH OIL TRIPLE STRENGTH) 1400 MG CAPS Take 1,400 mg by mouth  daily.      . potassium chloride SA (K-DUR,KLOR-CON) 20 MEQ tablet Take 20 mEq by mouth daily.     Marland Kitchen VICTOZA 18 MG/3ML SOPN Inject 1.8 mg into the skin daily.  3  . carvedilol (COREG) 12.5 MG tablet Take 1 tablet (12.5 mg total) by mouth 2 (two) times daily. 180 tablet 3   No current facility-administered medications for this visit.     Allergies:   Patient has no known allergies.    Social History:  The patient  reports that  has never smoked. he has never used smokeless tobacco. He reports that he drinks alcohol. He reports that he does not use drugs.   Family History:  The patient's family history includes Cancer in his sister; Diabetes in his father; Heart attack in his father, mother, and paternal grandmother; Heart disease in his father; Hypertension in his father.     ROS:  Please see the history of present illness. All other systems are reviewed and negative.    PHYSICAL EXAM: VS:  BP 130/86   Pulse 100   Ht 5\' 9"  (1.753 m)   Wt (!) 306 lb 12.8 oz (139.2 kg)   BMI 45.31 kg/m  , BMI Body mass index is 45.31 kg/m. GEN: Well nourished, well developed, male in no acute distress  HEENT: normal for age  Neck: no JVD, no carotid bruit, no masses Cardiac: RRR; no murmur, no rubs, or gallops Respiratory:  clear to auscultation bilaterally, normal work of breathing GI: soft, nontender, nondistended, + BS MS: no deformity or atrophy; no edema; distal pulses are 2+ in all 4 extremities   Skin: warm and dry, no rash Neuro:  Strength and sensation are intact Psych: euthymic mood, full affect   EKG:  EKG is ordered today. ECG shows sinus rhythm with a heart rate of 100.  He has diffuse T wave flattening which is unchanged from previous ECGs.  ECHO: 11/09/2017 - Left ventricle: The cavity size was normal. Systolic function was   normal. The estimated ejection fraction was in the range of 15%   to 20%. Severe diffuse hypokinesis. Regional wall motion   abnormalities cannot be excluded. Doppler parameters are   consistent with a reversible restrictive pattern, indicative of   decreased left ventricular diastolic compliance and/or increased   left atrial pressure (grade 3 diastolic dysfunction). - Mitral valve: There was mild regurgitation. - Left atrium: The atrium was moderately to severely dilated.  CATH: 11/13/2017 Conclusions: 1. Large, tortuous coronary arteries without angiographically significant coronary artery disease. 2. Normal left and right heart filling pressures. 3. Normal pulmonary artery pressure. 4. Low normal Fick cardiac index. Recommendations: 1. Medical therapy for non-ischemic cardiomyopathy.  Recent Labs: 11/09/2017: B Natriuretic Peptide 48.5 11/13/2017: ALT 35; Platelets 233 11/17/2017: BUN 44; Creatinine, Ser 2.00;  Hemoglobin 15.6; Potassium 4.3; Sodium 140    Lipid Panel    Component Value Date/Time   CHOL (H) 12/18/2010 0425    207        ATP III CLASSIFICATION:  <200     mg/dL   Desirable  161-096  mg/dL   Borderline High  >=045    mg/dL   High          TRIG 409 (H) 12/18/2010 0425   HDL 41 12/18/2010 0425   CHOLHDL 5.0 12/18/2010 0425   VLDL 70 (H) 12/18/2010 0425   LDLCALC  12/18/2010 0425    96        Total Cholesterol/HDL:CHD Risk Coronary  Heart Disease Risk Table                     Men   Women  1/2 Average Risk   3.4   3.3  Average Risk       5.0   4.4  2 X Average Risk   9.6   7.1  3 X Average Risk  23.4   11.0        Use the calculated Patient Ratio above and the CHD Risk Table to determine the patient's CHD Risk.        ATP III CLASSIFICATION (LDL):  <100     mg/dL   Optimal  161-096  mg/dL   Near or Above                    Optimal  130-159  mg/dL   Borderline  045-409  mg/dL   High  >811     mg/dL   Very High     Wt Readings from Last 3 Encounters:  11/27/17 (!) 306 lb 12.8 oz (139.2 kg)  11/13/17 (!) 301 lb 12.8 oz (136.9 kg)  04/07/16 298 lb 9.6 oz (135.4 kg)     Other studies Reviewed: Additional studies/ records that were reviewed today include: Office notes, hospital records and testing.  ASSESSMENT AND PLAN:  1.  Chronic combined systolic and diastolic CHF: His volume status is good by exam.  I explained that his heart rate is elevated and his blood pressure is at the upper limit of normal.  I feel he would do better with a lower blood pressure and heart rate.  I will increase the carvedilol from 6.25 mg twice daily to 12.5 mg twice daily.  We will check a BMET today.  If his renal function is stable, add low-dose Entresto.  2.  Acute renal insufficiency on chronic kidney disease: His BUN was 28 with creatinine 1.64 on admission.  Creatinine went down to 1.52 but then increased to over 1.7 for the rest of his stay.  However, when he came back to the ER on  January 4, his creatinine was 2.0.  Check a BMET today.  3.  Hypertension: I believe his blood pressure will tolerate the increased beta-blocker as well as a low dose of Entresto (if his renal function allows).   Current medicines are reviewed at length with the patient today.  The patient does not have concerns regarding medicines.  The following changes have been made: Increase beta-blocker  Labs/ tests ordered today include:   Orders Placed This Encounter  Procedures  . Basic metabolic panel  . EKG 12-Lead     Disposition:   FU with Dr. Duke Salvia  Signed, Theodore Demark, PA-C  11/27/2017 5:03 PM    [facility] Phone: [phone]; Fax: [phone]  This note was written with the assistance of speech recognition software. Please excuse any transcriptional errors.

## 2017-11-28 LAB — BASIC METABOLIC PANEL
BUN/Creatinine Ratio: 18 (ref 9–20)
BUN: 31 mg/dL — ABNORMAL HIGH (ref 6–24)
CO2: 22 mmol/L (ref 20–29)
CREATININE: 1.75 mg/dL — AB (ref 0.76–1.27)
Calcium: 9.8 mg/dL (ref 8.7–10.2)
Chloride: 100 mmol/L (ref 96–106)
GFR, EST AFRICAN AMERICAN: 49 mL/min/{1.73_m2} — AB (ref >59)
GFR, EST NON AFRICAN AMERICAN: 42 mL/min/{1.73_m2} — AB (ref >59)
Glucose: 130 mg/dL — ABNORMAL HIGH (ref 65–99)
Potassium: 4.8 mmol/L (ref 3.5–5.2)
SODIUM: 139 mmol/L (ref 134–144)

## 2017-12-05 ENCOUNTER — Telehealth: Payer: Self-pay | Admitting: Cardiovascular Disease

## 2017-12-05 DIAGNOSIS — Z79899 Other long term (current) drug therapy: Secondary | ICD-10-CM

## 2017-12-05 MED ORDER — SACUBITRIL-VALSARTAN 24-26 MG PO TABS: 1.0000 | ORAL_TABLET | Freq: Two times a day (BID) | ORAL | 5 refills | Status: DC

## 2017-12-05 NOTE — Telephone Encounter (Signed)
Patient of Dr. Duke Salvia returning a call to [facility] regarding lab work ordered by [place], [place]. Patient aware of BMET results and recommendations regarding new med and lab work. He has Nurse, learning disability. Co-pay card and free 1 month supply card will be left for him to pick up along with lab order. Rx(s) sent to pharmacy electronically.

## 2017-12-06 ENCOUNTER — Telehealth: Payer: Self-pay | Admitting: Cardiovascular Disease

## 2017-12-06 NOTE — Telephone Encounter (Signed)
New message   Patient calling with concerns about shortness of breath, started around 11.   Pt c/o medication issue:  1. Name of Medication: sacubitril-valsartan (ENTRESTO) 24-26 MG  2. How are you currently taking this medication (dosage and times per day)?  As prescribed  3. Are you having a reaction (difficulty breathing--STAT)?N/A  4. What is your medication issue? Wants to know side effects of taking medication.   Pt c/o Shortness Of Breath: STAT if SOB developed within the last 24 hours or pt is noticeably SOB on the phone  1. Are you currently SOB (can you hear that pt is SOB on the phone)? No  2. How long have you been experiencing SOB? 1 hour  3. Are you SOB when sitting or when up moving around? When moving around or talking  4. Are you currently experiencing any other symptoms? Chest tightness on left side

## 2017-12-06 NOTE — Telephone Encounter (Signed)
Shortness of breath is not an expected ADR of Entresto.  Patient STOP taking lisinopril? Any other symptoms like swelling, rash, etc?  If no other symptoms noted; please continue Entresto as prescribed and contact clinic if SOB continues or any other problems noted.

## 2017-12-06 NOTE — Telephone Encounter (Signed)
Left a message to call back.

## 2017-12-06 NOTE — Telephone Encounter (Signed)
Patient called and made aware. He denied any other symptoms at this time. He will call the office back if needed.

## 2017-12-06 NOTE — Telephone Encounter (Signed)
Patient called in wondering if Miguel Ho can cause shortness of breath. He stated that he started the medication yesterday. He normally has some shortness of breath but he stated this feels different. He stated he is not having any other symptoms and feels fine otherwise. When he is talking he gets short of breath and has to stop for a minute to catch his breath. Message will be routed to pharmd for their recommendation.

## 2017-12-06 NOTE — Telephone Encounter (Signed)
Left message to call back  

## 2017-12-11 ENCOUNTER — Telehealth: Payer: Self-pay | Admitting: *Deleted

## 2017-12-11 NOTE — Telephone Encounter (Signed)
Call placed to the patient to check on his shortness of breath. He stated that he felt "wonderful" and was not having anymore concerns. He will come this week for his BMET.

## 2017-12-15 LAB — BASIC METABOLIC PANEL
BUN/Creatinine Ratio: 23 — ABNORMAL HIGH (ref 9–20)
BUN: 45 mg/dL — AB (ref 6–24)
CALCIUM: 10 mg/dL (ref 8.7–10.2)
CO2: 20 mmol/L (ref 20–29)
Chloride: 100 mmol/L (ref 96–106)
Creatinine, Ser: 1.93 mg/dL — ABNORMAL HIGH (ref 0.76–1.27)
GFR, EST AFRICAN AMERICAN: 43 mL/min/{1.73_m2} — AB (ref >59)
GFR, EST NON AFRICAN AMERICAN: 38 mL/min/{1.73_m2} — AB (ref >59)
Glucose: 108 mg/dL — ABNORMAL HIGH (ref 65–99)
Potassium: 4.6 mmol/L (ref 3.5–5.2)
Sodium: 138 mmol/L (ref 134–144)

## 2017-12-21 ENCOUNTER — Other Ambulatory Visit: Payer: Self-pay

## 2017-12-21 MED ORDER — HYDRALAZINE HCL 50 MG PO TABS: 50.0000 mg | ORAL_TABLET | Freq: Three times a day (TID) | ORAL | 3 refills | Status: DC

## 2017-12-21 NOTE — Telephone Encounter (Signed)
I was on the phone giving patient lab results and he requested a refill on Hydralazine.

## 2018-01-01 ENCOUNTER — Encounter: Payer: Self-pay | Admitting: Physician Assistant

## 2018-01-01 ENCOUNTER — Ambulatory Visit (INDEPENDENT_AMBULATORY_CARE_PROVIDER_SITE_OTHER): Payer: Managed Care, Other (non HMO) | Admitting: Physician Assistant

## 2018-01-01 VITALS — BP 134/68 | HR 106 | Ht 69.0 in | Wt 295.0 lb

## 2018-01-01 DIAGNOSIS — E1169 Type 2 diabetes mellitus with other specified complication: Secondary | ICD-10-CM

## 2018-01-01 DIAGNOSIS — I428 Other cardiomyopathies: Secondary | ICD-10-CM | POA: Diagnosis not present

## 2018-01-01 DIAGNOSIS — I10 Essential (primary) hypertension: Secondary | ICD-10-CM | POA: Diagnosis not present

## 2018-01-01 DIAGNOSIS — I5042 Chronic combined systolic (congestive) and diastolic (congestive) heart failure: Secondary | ICD-10-CM | POA: Diagnosis not present

## 2018-01-01 DIAGNOSIS — E785 Hyperlipidemia, unspecified: Secondary | ICD-10-CM | POA: Diagnosis not present

## 2018-01-01 LAB — BASIC METABOLIC PANEL
BUN / CREAT RATIO: 21 — AB (ref 9–20)
BUN: 36 mg/dL — ABNORMAL HIGH (ref 6–24)
CO2: 21 mmol/L (ref 20–29)
CREATININE: 1.7 mg/dL — AB (ref 0.76–1.27)
Calcium: 9.9 mg/dL (ref 8.7–10.2)
Chloride: 105 mmol/L (ref 96–106)
GFR calc Af Amer: 51 mL/min/{1.73_m2} — ABNORMAL LOW (ref >59)
GFR, EST NON AFRICAN AMERICAN: 44 mL/min/{1.73_m2} — AB (ref >59)
Glucose: 84 mg/dL (ref 65–99)
Potassium: 4.3 mmol/L (ref 3.5–5.2)
SODIUM: 141 mmol/L (ref 134–144)

## 2018-01-01 NOTE — Patient Instructions (Signed)
Medication Instructions: Your physician recommends that you continue on your current medications as directed. Please refer to the Current Medication list given to you today.  If you need a refill on your cardiac medications before your next appointment, please call your pharmacy.   Labwork: Your provider would like for you to have the following labs today: BMET   Procedures/Testing: Your physician has requested that you have an echocardiogram in 2 months. Echocardiography is a painless test that uses sound waves to create images of your heart. It provides your doctor with information about the size and shape of your heart and how well your heart's chambers and valves are working. This procedure takes approximately one hour. There are no restrictions for this procedure. This will take place at [address].     Follow-Up: Your physician wants you to follow-up in: 2-3 months with Dr. Duke Salvia (after the ECHO has been completed in 2 months) You will receive a reminder letter in the mail two months in advance. If you don't receive a letter, please call our office at [phone] to schedule this follow-up appointment.   Thank you for choosing [facility]!!

## 2018-01-01 NOTE — Progress Notes (Signed)
Cardiology Office Note   Date:  01/01/2018   ID:  [name], DOB 1961-03-09, MRN [id number]  PCP:  Ralene Ok, MD  Cardiologist: Dr. Duke Salvia, 04/05/2016 Theodore Demark, PA-C 11/27/2017  Chief Complaint  Patient presents with  . Follow-up    History of Present Illness: Miguel Ho is a 57 y.o. male with a history of S-D-CHF, DM, HTN, HLD, OSA on CPAP, morbid obesity, gout, GERD, CKD III, EF 15% 10/2017, NICM w/ nl cors at cath  01/14 office visit, weight 306 pounds, carvedilol increased, labs reviewed and Entresto added, follow-up made.  Follow-up labs showed BUN/creatinine went from 31/1.75>> 45/1.93  Miguel Ho presents for cardiology follow up.   He is breathing much better. He now has CPAP, is using it nightly.  He feels a little tired sometimes in the morning. Wonders if he needs another sleep study. Dr Ludwig Clarks manages his CPAP.   LE edema has improved. Has orthopnea, chronic, not that much. No recent change. No PND.  He feels he is able to do more. He has not been exercising, but feels he can start back. His back bothered him when doing some things, is better now.   Does not feel limited by SOB.  He has not felt lightheaded or dizzy with exertion.  No orthostatic symptoms.  Is drinking ginger water and lemon/lime water, feels that it helps him.    Past Medical History:  Diagnosis Date  . Childhood asthma   . Chronic combined systolic and diastolic heart failure (HCC) 10/12/2015  . Diabetic peripheral neuropathy (HCC)   . Essential hypertension 10/12/2015  . GERD (gastroesophageal reflux disease)   . Gout    "on daily RX" (11/09/2017)  . Hyperlipidemia 10/12/2015  . Morbid obesity (HCC) 10/12/2015  . OSA on CPAP   . Type II diabetes mellitus (HCC)     Past Surgical History:  Procedure Laterality Date  . CARDIAC CATHETERIZATION  2003   nl cors, Dr Elsie Lincoln  . RIGHT/LEFT HEART CATH AND CORONARY ANGIOGRAPHY N/A 11/13/2017   Procedure:  RIGHT/LEFT HEART CATH AND CORONARY ANGIOGRAPHY;  Surgeon: [name], MD;  Location: [facility];  Service: Cardiovascular;  Laterality: N/A;  . TONSILLECTOMY      Current Outpatient Medications  Medication Sig Dispense Refill  . allopurinol (ZYLOPRIM) 300 MG tablet Take 300 mg by mouth daily.      Marland Kitchen aspirin EC 81 MG tablet Take 81 mg by mouth daily.      . carvedilol (COREG) 12.5 MG tablet Take 1 tablet (12.5 mg total) by mouth 2 (two) times daily. 180 tablet 3  . furosemide (LASIX) 40 MG tablet Take 40 mg by mouth daily.      Marland Kitchen gabapentin (NEURONTIN) 300 MG capsule Take 300 mg by mouth at bedtime.  1  . hydrALAZINE (APRESOLINE) 50 MG tablet Take 1 tablet (50 mg total) by mouth 3 (three) times daily. 90 tablet 3  . LEVEMIR FLEXTOUCH 100 UNIT/ML Pen Inject 70 Units into the skin daily.   4  . lovastatin (MEVACOR) 40 MG tablet Take 40 mg by mouth at bedtime.      . metFORMIN (GLUCOPHAGE) 500 MG tablet Take 500 mg by mouth 2 (two) times daily with a meal.    . Omega-3 Fatty Acids (FISH OIL TRIPLE STRENGTH) 1400 MG CAPS Take 1,400 mg by mouth daily.      . potassium chloride SA (K-DUR,KLOR-CON) 20 MEQ tablet Take 20 mEq by mouth daily.     Marland Kitchen  sacubitril-valsartan (ENTRESTO) 24-26 MG Take 1 tablet by mouth 2 (two) times daily. 60 tablet 5  . VICTOZA 18 MG/3ML SOPN Inject 1.8 mg into the skin daily.  3   No current facility-administered medications for this visit.     Allergies:   Patient has no known allergies.    Social History:  The patient  reports that  has never smoked. he has never used smokeless tobacco. He reports that he drinks alcohol. He reports that he does not use drugs.   Family History:  The patient's family history includes Cancer in his sister; Diabetes in his father; Heart attack in his father, mother, and paternal grandmother; Heart disease in his father; Hypertension in his father.   ROS:  Please see the history of present illness. All other systems are  reviewed and negative.    PHYSICAL EXAM: VS:  BP 134/68   Pulse (!) 106   Ht 5\' 9"  (1.753 m)   Wt 295 lb (133.8 kg)   BMI 43.56 kg/m  , BMI Body mass index is 43.56 kg/m. GEN: Well nourished, well developed, male in no acute distress  HEENT: normal for age  Neck: no JVD, no carotid bruit, no masses Cardiac: RRR; soft murmur, no rubs, or gallops Respiratory:  clear to auscultation bilaterally, normal work of breathing GI: soft, nontender, nondistended, + BS MS: no deformity or atrophy; trace LE edema; distal pulses are 2+ in all 4 extremities   Skin: warm and dry, no rash Neuro:  Strength and sensation are intact Psych: euthymic mood, full affect   EKG:  EKG is not ordered today.  ECHO: 11/09/2017 - Left ventricle: The cavity size was normal. Systolic function was normal. The estimated ejection fraction was in the range of 15% to 20%. Severe diffuse hypokinesis. Regional wall motion abnormalities cannot be excluded. Doppler parameters are consistent with a reversible restrictive pattern, indicative of decreased left ventricular diastolic compliance and/or increased left atrial pressure (grade 3 diastolic dysfunction). - Mitral valve: There was mild regurgitation. - Left atrium: The atrium was moderately to severely dilated.   Recent Labs: 11/09/2017: B Natriuretic Peptide 48.5 11/13/2017: ALT 35; Platelets 233 11/17/2017: Hemoglobin 15.6 12/15/2017: BUN 45; Creatinine, Ser 1.93; Potassium 4.6; Sodium 138    Lipid Panel    Component Value Date/Time   CHOL (H) 12/18/2010 0425    207        ATP III CLASSIFICATION:  <200     mg/dL   Desirable  067-703  mg/dL   Borderline High  >=403    mg/dL   High          TRIG 524 (H) 12/18/2010 0425   HDL 41 12/18/2010 0425   CHOLHDL 5.0 12/18/2010 0425   VLDL 70 (H) 12/18/2010 0425   LDLCALC  12/18/2010 0425    96        Total Cholesterol/HDL:CHD Risk Coronary Heart Disease Risk Table                     Men    Women  1/2 Average Risk   3.4   3.3  Average Risk       5.0   4.4  2 X Average Risk   9.6   7.1  3 X Average Risk  23.4   11.0        Use the calculated Patient Ratio above and the CHD Risk Table to determine the patient's CHD Risk.        ATP  III CLASSIFICATION (LDL):  <100     mg/dL   Optimal  132-440  mg/dL   Near or Above                    Optimal  130-159  mg/dL   Borderline  102-725  mg/dL   High  >366     mg/dL   Very High     Wt Readings from Last 3 Encounters:  01/01/18 295 lb (133.8 kg)  11/27/17 (!) 306 lb 12.8 oz (139.2 kg)  11/13/17 (!) 301 lb 12.8 oz (136.9 kg)     Other studies Reviewed: Additional studies/ records that were reviewed today include: office notes, hospital records and testing.  ASSESSMENT AND PLAN:  1.  Chronic systolic and diastolic CHF: His weight is down.  He has been more compliant low-sodium diabetic diet.  He is seeing the benefits of this and is encouraged to continue it.  Lasix and potassium at the current doses.  Check a BMET today.  He has had his medications today and his heart rate is elevated.  Hopefully, he is not dehydrated.  2.  Dyslipidemia: This is managed by Dr. Ludwig Clarks.  However, he has not been taking the fish oil he was previously taking.  This was not prescribed by anyone, he just thought it would help.  I gave him the names of some fish that are high in omega-3 oils and advised that if he wished to eat them on a regular basis instead of taking the pills that might be okay.  Check with Dr. Ludwig Clarks if he has any questions.  The statin.  3.  Nonischemic cardiomyopathy: He was started on Entresto and seems to be tolerating it well.  We will check a BMET today.  If his renal function and potassium are stable, will continue this.  Continue beta blocker and hydralazine.  4.  Hypertension: Patient says he is taken his medications today but they may not have kicked in yet.  Continue to follow for now.  Current medicines are reviewed  at length with the patient today.  The patient has concerns regarding medicines.  Concerns were addressed  The following changes have been made:  no change  Labs/ tests ordered today include:   Orders Placed This Encounter  Procedures  . Basic metabolic panel  . ECHOCARDIOGRAM COMPLETE     Disposition:   FU with Dr. Duke Salvia  Signed, Theodore Demark, PA-C  01/01/2018 1:16 PM    [facility] Phone: [phone]; Fax: [phone]  This note was written with the assistance of speech recognition software. Please excuse any transcriptional errors.

## 2018-01-29 ENCOUNTER — Telehealth: Payer: Self-pay | Admitting: *Deleted

## 2018-01-29 MED ORDER — CARVEDILOL 25 MG PO TABS: 25.0000 mg | ORAL_TABLET | Freq: Two times a day (BID) | ORAL | 3 refills | Status: DC

## 2018-01-29 NOTE — Telephone Encounter (Signed)
-----   Message from Chilton Si, MD sent at 01/25/2018 10:37 AM EDT ----- Regarding: med change Miguel Ho's dentist reported that his BP is running high.  Please increase carvedilol to 25mg  bid.  Thanks!

## 2018-01-29 NOTE — Telephone Encounter (Signed)
Advised patient, verbalized understanding. He will monitor his blood pressure at home if remains elevated

## 2018-03-02 ENCOUNTER — Other Ambulatory Visit (HOSPITAL_COMMUNITY): Payer: Managed Care, Other (non HMO)

## 2018-03-07 ENCOUNTER — Other Ambulatory Visit: Payer: Self-pay | Admitting: Cardiovascular Disease

## 2018-03-07 NOTE — Telephone Encounter (Signed)
REFILL 

## 2018-03-08 ENCOUNTER — Ambulatory Visit (HOSPITAL_COMMUNITY): Payer: Managed Care, Other (non HMO)

## 2018-03-12 ENCOUNTER — Ambulatory Visit (HOSPITAL_COMMUNITY): Payer: Managed Care, Other (non HMO)

## 2018-03-12 ENCOUNTER — Ambulatory Visit (HOSPITAL_COMMUNITY): Payer: Managed Care, Other (non HMO) | Attending: Cardiology

## 2018-03-12 ENCOUNTER — Encounter (INDEPENDENT_AMBULATORY_CARE_PROVIDER_SITE_OTHER): Payer: Self-pay

## 2018-03-12 DIAGNOSIS — I429 Cardiomyopathy, unspecified: Secondary | ICD-10-CM | POA: Insufficient documentation

## 2018-03-12 DIAGNOSIS — I428 Other cardiomyopathies: Secondary | ICD-10-CM | POA: Diagnosis present

## 2018-03-12 DIAGNOSIS — I10 Essential (primary) hypertension: Secondary | ICD-10-CM | POA: Insufficient documentation

## 2018-03-29 ENCOUNTER — Other Ambulatory Visit (HOSPITAL_COMMUNITY): Payer: Managed Care, Other (non HMO)

## 2018-03-31 ENCOUNTER — Other Ambulatory Visit: Payer: Self-pay | Admitting: Cardiovascular Disease

## 2018-04-10 ENCOUNTER — Ambulatory Visit (INDEPENDENT_AMBULATORY_CARE_PROVIDER_SITE_OTHER): Payer: Managed Care, Other (non HMO) | Admitting: Cardiovascular Disease

## 2018-04-10 ENCOUNTER — Encounter: Payer: Self-pay | Admitting: Cardiovascular Disease

## 2018-04-10 VITALS — BP 110/78 | HR 99 | Ht 69.0 in | Wt 283.2 lb

## 2018-04-10 DIAGNOSIS — Z79899 Other long term (current) drug therapy: Secondary | ICD-10-CM | POA: Diagnosis not present

## 2018-04-10 DIAGNOSIS — I5042 Chronic combined systolic (congestive) and diastolic (congestive) heart failure: Secondary | ICD-10-CM

## 2018-04-10 DIAGNOSIS — I10 Essential (primary) hypertension: Secondary | ICD-10-CM | POA: Diagnosis not present

## 2018-04-10 DIAGNOSIS — E1169 Type 2 diabetes mellitus with other specified complication: Secondary | ICD-10-CM | POA: Diagnosis not present

## 2018-04-10 DIAGNOSIS — E785 Hyperlipidemia, unspecified: Secondary | ICD-10-CM

## 2018-04-10 LAB — COMPREHENSIVE METABOLIC PANEL
ALT: 22 IU/L (ref 0–44)
AST: 24 IU/L (ref 0–40)
Albumin/Globulin Ratio: 1.6 (ref 1.2–2.2)
Albumin: 4.5 g/dL (ref 3.5–5.5)
Alkaline Phosphatase: 70 IU/L (ref 39–117)
BILIRUBIN TOTAL: 0.3 mg/dL (ref 0.0–1.2)
BUN/Creatinine Ratio: 17 (ref 9–20)
BUN: 32 mg/dL — AB (ref 6–24)
CALCIUM: 9.6 mg/dL (ref 8.7–10.2)
CHLORIDE: 103 mmol/L (ref 96–106)
CO2: 21 mmol/L (ref 20–29)
Creatinine, Ser: 1.87 mg/dL — ABNORMAL HIGH (ref 0.76–1.27)
GFR calc non Af Amer: 39 mL/min/{1.73_m2} — ABNORMAL LOW (ref >59)
GFR, EST AFRICAN AMERICAN: 45 mL/min/{1.73_m2} — AB (ref >59)
GLUCOSE: 76 mg/dL (ref 65–99)
Globulin, Total: 2.8 g/dL (ref 1.5–4.5)
Potassium: 4.6 mmol/L (ref 3.5–5.2)
Sodium: 138 mmol/L (ref 134–144)
TOTAL PROTEIN: 7.3 g/dL (ref 6.0–8.5)

## 2018-04-10 LAB — LIPID PANEL
Chol/HDL Ratio: 3.5 ratio (ref 0.0–5.0)
Cholesterol, Total: 125 mg/dL (ref 100–199)
HDL: 36 mg/dL — AB (ref >39)
LDL Calculated: 61 mg/dL (ref 0–99)
TRIGLYCERIDES: 139 mg/dL (ref 0–149)
VLDL CHOLESTEROL CAL: 28 mg/dL (ref 5–40)

## 2018-04-10 NOTE — Progress Notes (Signed)
Cardiology Office Note   Date:  04/10/2018   ID:  Miguel Ho, DOB Mar 19, 1961, MRN [id number]  PCP:  Ralene Ok, MD  Cardiologist:   Chilton Si, MD   No chief complaint on file.   History of Present Illness: Miguel Ho is a 57 y.o. male with chronic systolic and diastolic heart failure, hypertension, hyperlipidemia, OSA, and diabetes who presents for follow up.  He was initially seen in 2017 for management of chronic systolic heart failure.  Miguel Ho reports that he was diagnosed with heart failure in the early 2000s. He reports undergoing cardiac catheterization that did not reveal any significant blockages.  He followed up with a cardiologist for short period of time but had not been seen in several years.  He had an echo 12/16 that revealed LVEF 40-45% and hypokinesis of the mid-apical anterolateral myocardium.  There was grade 1 diastolic dysfunction.  Overall Miguel Ho has been doing well.  He has noted some LE edema after eating certain foods.  Miguel Ho has a CPAP but uses it off/on.  Since his last appointment Miguel Ho was admitted 10/2017 with acute on chronic heart failure.Echo at that time revealed a reduction in his LVEF to 15 to 20%.  He was diuresed and started on Entresto.  Left heart catheterization revealed no coronary artery disease.  He followed up with Miguel Demark, PA, and was referred for repeat echo 03/12/18 that showed some improvement in his LVEF to 30%.  Since his last appointment Miguel Ho has been doing well.  He has not had any chest pain or shortness of breath.  He also denies lower extremity edema, orthopnea, or PND.  He has been working hard on his diet and exercise.  He rides his exercise bike and works in the yard.  He is making a conscious effort to eat healthier foods.  He limits his fluid and sodium intake.  He eats most of his meals at home.  He had some mild shortness of breath yesterday but attributes this to being out  in the sun grilling all day.  In general he has felt very well.   Past Medical History:  Diagnosis Date  . Childhood asthma   . Chronic combined systolic and diastolic heart failure (HCC) 10/12/2015  . Diabetic peripheral neuropathy (HCC)   . Essential hypertension 10/12/2015  . GERD (gastroesophageal reflux disease)   . Gout    "on daily RX" (11/09/2017)  . Hyperlipidemia 10/12/2015  . Morbid obesity (HCC) 10/12/2015  . OSA on CPAP   . Type II diabetes mellitus (HCC)     Past Surgical History:  Procedure Laterality Date  . CARDIAC CATHETERIZATION  2003   nl cors, Dr Elsie Lincoln  . RIGHT/LEFT HEART CATH AND CORONARY ANGIOGRAPHY N/A 11/13/2017   Procedure: RIGHT/LEFT HEART CATH AND CORONARY ANGIOGRAPHY;  Surgeon: [name], MD;  Location: [facility];  Service: Cardiovascular;  Laterality: N/A;  . TONSILLECTOMY       Current Outpatient Medications  Medication Sig Dispense Refill  . allopurinol (ZYLOPRIM) 300 MG tablet Take 300 mg by mouth daily.      Marland Kitchen aspirin EC 81 MG tablet Take 81 mg by mouth daily.      . carvedilol (COREG) 25 MG tablet Take 1 tablet (25 mg total) by mouth 2 (two) times daily. 180 tablet 3  . furosemide (LASIX) 40 MG tablet Take 40 mg by mouth daily.      Marland Kitchen gabapentin (NEURONTIN) 300 MG  capsule Take 300 mg by mouth at bedtime.  1  . hydrALAZINE (APRESOLINE) 50 MG tablet TAKE 1 TABLET BY MOUTH THREE TIMES A DAY 90 tablet 3  . LEVEMIR FLEXTOUCH 100 UNIT/ML Pen Inject 70 Units into the skin daily.   4  . lovastatin (MEVACOR) 40 MG tablet Take 40 mg by mouth at bedtime.      . metFORMIN (GLUCOPHAGE) 500 MG tablet Take 500 mg by mouth 2 (two) times daily with a meal.    . Omega-3 Fatty Acids (FISH OIL TRIPLE STRENGTH) 1400 MG CAPS Take 1,400 mg by mouth daily.      . potassium chloride SA (K-DUR,KLOR-CON) 20 MEQ tablet Take 20 mEq by mouth daily.     . sacubitril-valsartan (ENTRESTO) 24-26 MG Take 1 tablet by mouth 2 (two) times daily. 60 tablet 5  .  OZEMPIC 1 MG/DOSE SOPN Inject 1 mg as directed once a week.     No current facility-administered medications for this visit.     Allergies:   Patient has no known allergies.    Social History:  The patient  reports that he has never smoked. He has never used smokeless tobacco. He reports that he drinks alcohol. He reports that he does not use drugs.   Family History:  The patient's family history includes Cancer in his sister; Diabetes in his father; Heart attack in his father, mother, and paternal grandmother; Heart disease in his father; Hypertension in his father.    ROS:  Please see the history of present illness.   Otherwise, review of systems are positive for none.   All other systems are reviewed and negative.    PHYSICAL EXAM: VS:  BP 110/78   Pulse 99   Ht 5\' 9"  (1.753 m)   Wt 283 lb 3.2 oz (128.5 kg)   BMI 41.82 kg/m  , BMI Body mass index is 41.82 kg/m. GENERAL:  Well appearing HEENT: Pupils equal round and reactive, fundi not visualized, oral mucosa unremarkable NECK:  No jugular venous distention, waveform within normal limits, carotid upstroke brisk and symmetric, no bruits LUNGS:  Clear to auscultation bilaterally HEART:  RRR.  PMI not displaced or sustained,S1 and S2 within normal limits, no S3, no S4, no clicks, no rubs, no murmurs ABD:  Flat, positive bowel sounds normal in frequency in pitch, no bruits, no rebound, no guarding, no midline pulsatile mass, no hepatomegaly, no splenomegaly EXT:  2 plus pulses throughout, trace edema, no cyanosis no clubbing SKIN:  No rashes no nodules NEURO:  Cranial nerves II through XII grossly intact, motor grossly intact throughout PSYCH:  Cognitively intact, oriented to person place and time   EKG:  EKG is not ordered today. The ekg ordered 10/12/15 demonstrates Sinus tachycardia rate 105 bpm.  low voltage in the limb and precordial leads.  Echo 03/12/18: Study Conclusions  - Left ventricle: Apical window is  foreshoertened some,   particularly in 2 chamber views. LVEF is approximately 30%with   diffuse hypokinesis. In comparison to images from December 2018   there is no significant change. The cavity size was mildly   dilated. Wall thickness was increased in a pattern of mild LVH.  Echo 10/23/15: Study Conclusions  - Left ventricle: The cavity size was normal. Wall thickness was  increased in a pattern of mild LVH. Systolic function was mildly  to moderately reduced. The estimated ejection fraction was in the  range of 40% to 45%. There is hypokinesis of the  mid-apicalanterolateral myocardium. Doppler  parameters are  consistent with abnormal left ventricular relaxation (grade 1  diastolic dysfunction).  LHC/RHC 11/13/17: 1. Large, tortuous coronary arteries without angiographically significant coronary artery disease. 2. Normal left and right heart filling pressures. 3. Normal pulmonary artery pressure. 4. Low normal Fick cardiac index.  RA 6, RV 32/9, PA 26/15, mean PA 19, PCWP 12 .   Recent Labs: 11/09/2017: B Natriuretic Peptide 48.5 11/13/2017: ALT 35; Platelets 233 11/17/2017: Hemoglobin 15.6 01/01/2018: BUN 36; Creatinine, Ser 1.70; Potassium 4.3; Sodium 141    Lipid Panel    Component Value Date/Time   CHOL (H) 12/18/2010 0425    207        ATP III CLASSIFICATION:  <200     mg/dL   Desirable  409-811  mg/dL   Borderline High  >=914    mg/dL   High          TRIG 782 (H) 12/18/2010 0425   HDL 41 12/18/2010 0425   CHOLHDL 5.0 12/18/2010 0425   VLDL 70 (H) 12/18/2010 0425   LDLCALC  12/18/2010 0425    96        Total Cholesterol/HDL:CHD Risk Coronary Heart Disease Risk Table                     Men   Women  1/2 Average Risk   3.4   3.3  Average Risk       5.0   4.4  2 X Average Risk   9.6   7.1  3 X Average Risk  23.4   11.0        Use the calculated Patient Ratio above and the CHD Risk Table to determine the patient's CHD Risk.        ATP III  CLASSIFICATION (LDL):  <100     mg/dL   Optimal  956-213  mg/dL   Near or Above                    Optimal  130-159  mg/dL   Borderline  086-578  mg/dL   High  >469     mg/dL   Very High      Wt Readings from Last 3 Encounters:  04/10/18 283 lb 3.2 oz (128.5 kg)  01/01/18 295 lb (133.8 kg)  11/27/17 (!) 306 lb 12.8 oz (139.2 kg)      ASSESSMENT AND PLAN:  # Chronic systolic heart failure: Mr. Coate LVEF was reduced to 15% thjen improved to 30% after 3 months of GDMT.  He continues to work on his diet and BP has been well controlled.  Repeat echo vs. MRI at the end of July (3 more months from last echo) to determine if he needs ICD.  Will decide on echo vs. MRI based on renal function.   # Hypertension: Blood pressure is well-controlled.  It is low but he remains asymptomatic.  Continue carvedilol and Entresto.   # Hyperlipidemia: Continue lovastatin.  Check lipids and CMP.  He may not need fish oil if triglycerides are better controlled.   # Obesity: Mr. Ensminger was congratulated on his weight loss.    # OSA: Continue CPAP.  Current medicines are reviewed at length with the patient today.  The patient does not have concerns regarding medicines.  The following changes have been made:  no change  Labs/ tests ordered today include:   Orders Placed This Encounter  Procedures  . Comprehensive metabolic panel  . Lipid panel  .  ECHOCARDIOGRAM COMPLETE     Disposition:   FU with Camren Henthorn C. Duke Salvia, MD in 2 months.    Signed, Chilton Si, MD  04/10/2018 10:45 AM    Miguel Ho

## 2018-04-10 NOTE — Patient Instructions (Signed)
Medication Instructions:  Your physician recommends that you continue on your current medications as directed. Please refer to the Current Medication list given to you today.  Labwork: LP/CMET TODAY   Testing/Procedures: Your physician has requested that you have an echocardiogram. Echocardiography is a painless test that uses sound waves to create images of your heart. It provides your doctor with information about the size and shape of your heart and how well your heart's chambers and valves are working. This procedure takes approximately one hour. There are no restrictions for this procedure. [facility] AT [address] END OF [address]  Follow-Up: Your physician recommends that you schedule a follow-up appointment in: WITH DR [facility] AFTER ECHO   If you need a refill on your cardiac medications before your next appointment, please call your pharmacy.

## 2018-05-19 ENCOUNTER — Other Ambulatory Visit: Payer: Self-pay | Admitting: Cardiovascular Disease

## 2018-05-21 NOTE — Telephone Encounter (Signed)
We don't do entresto refills - not an anticoagulant

## 2018-06-04 ENCOUNTER — Telehealth: Payer: Self-pay | Admitting: Cardiovascular Disease

## 2018-06-04 NOTE — Telephone Encounter (Signed)
Received records from Ralene Ok MD, MRCP on 06/01/18, Appt 06/21/18 @ 9:40AM. [place]

## 2018-06-11 ENCOUNTER — Other Ambulatory Visit: Payer: Self-pay

## 2018-06-11 ENCOUNTER — Ambulatory Visit (HOSPITAL_COMMUNITY): Payer: Managed Care, Other (non HMO) | Attending: Cardiovascular Disease

## 2018-06-11 DIAGNOSIS — G4733 Obstructive sleep apnea (adult) (pediatric): Secondary | ICD-10-CM | POA: Diagnosis not present

## 2018-06-11 DIAGNOSIS — E119 Type 2 diabetes mellitus without complications: Secondary | ICD-10-CM | POA: Diagnosis not present

## 2018-06-11 DIAGNOSIS — Z6841 Body Mass Index (BMI) 40.0 and over, adult: Secondary | ICD-10-CM | POA: Insufficient documentation

## 2018-06-11 DIAGNOSIS — I5042 Chronic combined systolic (congestive) and diastolic (congestive) heart failure: Secondary | ICD-10-CM

## 2018-06-11 DIAGNOSIS — I110 Hypertensive heart disease with heart failure: Secondary | ICD-10-CM | POA: Diagnosis not present

## 2018-06-11 DIAGNOSIS — E785 Hyperlipidemia, unspecified: Secondary | ICD-10-CM | POA: Insufficient documentation

## 2018-06-11 MED ORDER — PERFLUTREN LIPID MICROSPHERE
1.0000 mL | INTRAVENOUS | Status: AC | PRN
  Administered 2018-06-11: 3 mL via INTRAVENOUS

## 2018-06-21 ENCOUNTER — Encounter: Payer: Self-pay | Admitting: Cardiovascular Disease

## 2018-06-21 ENCOUNTER — Ambulatory Visit (INDEPENDENT_AMBULATORY_CARE_PROVIDER_SITE_OTHER): Payer: Managed Care, Other (non HMO) | Admitting: Cardiovascular Disease

## 2018-06-21 VITALS — BP 129/89 | HR 99 | Ht 69.0 in | Wt 278.0 lb

## 2018-06-21 DIAGNOSIS — I5042 Chronic combined systolic (congestive) and diastolic (congestive) heart failure: Secondary | ICD-10-CM

## 2018-06-21 DIAGNOSIS — E785 Hyperlipidemia, unspecified: Secondary | ICD-10-CM | POA: Diagnosis not present

## 2018-06-21 DIAGNOSIS — E1169 Type 2 diabetes mellitus with other specified complication: Secondary | ICD-10-CM | POA: Diagnosis not present

## 2018-06-21 DIAGNOSIS — I10 Essential (primary) hypertension: Secondary | ICD-10-CM

## 2018-06-21 NOTE — Patient Instructions (Signed)
Medication Instructions:  °Your physician recommends that you continue on your current medications as directed. Please refer to the Current Medication list given to you today.  ° °Labwork: °NONE ° °Testing/Procedures: °NONE ° °Follow-Up: °Your physician wants you to follow-up in: 6 MONTHS  You will receive a reminder letter in the mail two months in advance. If you don't receive a letter, please call our office to schedule the follow-up appointment. ° °If you need a refill on your cardiac medications before your next appointment, please call your pharmacy. °

## 2018-06-21 NOTE — Progress Notes (Signed)
Cardiology Office Note   Date:  06/21/2018   ID:  Miguel Ho, DOB 1961/08/07, MRN [id number]  PCP:  Ralene Ok, MD  Cardiologist:   Chilton Si, MD   No chief complaint on file.   History of Present Illness: Miguel Ho is a 57 y.o. male with chronic systolic and diastolic heart failure, hypertension, hyperlipidemia, OSA, and diabetes who presents for follow up.  He was initially seen in 2017 for management of chronic systolic heart failure.  Miguel Ho reports that he was diagnosed with heart failure in the early 2000s. He reports undergoing cardiac catheterization that did not reveal any significant blockages.  He followed up with a cardiologist for short period of time but had not been seen in several years.  He had an echo 12/16 that revealed LVEF 40-45% and hypokinesis of the mid-apical anterolateral myocardium.  There was grade 1 diastolic dysfunction.  Overall Miguel Ho has been doing well.  He has noted some LE edema after eating certain foods.  Miguel Ho has a CPAP but uses it off/on.  He was admitted 10/2017 with acute on chronic heart failure.  Echo at that time revealed a reduction in his LVEF to 15 to 20%.  He was diuresed and started on Entresto.  Left heart catheterization revealed no coronary artery disease.  He followed up with Theodore Demark, PA, and was referred for repeat echo 03/12/18 that showed some improvement in his LVEF to 30%.  He was started on Entresto, carvedilol and hydralazine.  Repeat echo 05/2018 showed an improvement in his EF to 50 to 55%.  Since his last appointment Miguel Ho has been doing very well.  He is completely change his eating habits.  He is eating lean meats and limiting red meats.  He also eats lots of fresh fruits and vegetables.  He has a garden and is eating many of the foods from there.  He has not had any lower extremity edema, orthopnea, or PND.  His breathing has been good.  He is also using his CPAP every night.   He is not getting much formal exercise but is more active.   Past Medical History:  Diagnosis Date  . Childhood asthma   . Chronic combined systolic and diastolic heart failure (HCC) 10/12/2015  . Diabetic peripheral neuropathy (HCC)   . Essential hypertension 10/12/2015  . GERD (gastroesophageal reflux disease)   . Gout    "on daily RX" (11/09/2017)  . Hyperlipidemia 10/12/2015  . Morbid obesity (HCC) 10/12/2015  . OSA on CPAP   . Type II diabetes mellitus (HCC)     Past Surgical History:  Procedure Laterality Date  . CARDIAC CATHETERIZATION  2003   nl cors, Dr Elsie Lincoln  . RIGHT/LEFT HEART CATH AND CORONARY ANGIOGRAPHY N/A 11/13/2017   Procedure: RIGHT/LEFT HEART CATH AND CORONARY ANGIOGRAPHY;  Surgeon: [name], MD;  Location: [facility];  Service: Cardiovascular;  Laterality: N/A;  . TONSILLECTOMY       Current Outpatient Medications  Medication Sig Dispense Refill  . allopurinol (ZYLOPRIM) 300 MG tablet Take 300 mg by mouth daily.      Marland Kitchen aspirin EC 81 MG tablet Take 81 mg by mouth daily.      . carvedilol (COREG) 25 MG tablet Take 25 mg by mouth 2 (two) times daily with a meal.    . ENTRESTO 24-26 MG TAKE 1 TABLET BY MOUTH TWICE A DAY 180 tablet 3  . furosemide (LASIX) 40 MG tablet  Take 40 mg by mouth daily.      Marland Kitchen gabapentin (NEURONTIN) 300 MG capsule Take 300 mg by mouth at bedtime.  1  . hydrALAZINE (APRESOLINE) 50 MG tablet TAKE 1 TABLET BY MOUTH THREE TIMES A DAY 90 tablet 3  . LEVEMIR FLEXTOUCH 100 UNIT/ML Pen Inject 70 Units into the skin daily.   4  . lovastatin (MEVACOR) 40 MG tablet Take 40 mg by mouth at bedtime.      . metFORMIN (GLUCOPHAGE) 500 MG tablet Take 500 mg by mouth 2 (two) times daily with a meal.    . OZEMPIC 1 MG/DOSE SOPN Inject 1 mg as directed once a week.    . potassium chloride SA (K-DUR,KLOR-CON) 20 MEQ tablet Take 20 mEq by mouth daily.      No current facility-administered medications for this visit.     Allergies:    Patient has no known allergies.    Social History:  The patient  reports that he has never smoked. He has never used smokeless tobacco. He reports that he drinks alcohol. He reports that he does not use drugs.   Family History:  The patient's family history includes Cancer in his sister; Diabetes in his father; Heart attack in his father, mother, and paternal grandmother; Heart disease in his father; Hypertension in his father.    ROS:  Please see the history of present illness.   Otherwise, review of systems are positive for none.   All other systems are reviewed and negative.    PHYSICAL EXAM: VS:  BP 129/89   Pulse 99   Ht 5\' 9"  (1.753 m)   Wt 278 lb (126.1 kg)   BMI 41.05 kg/m  , BMI Body mass index is 41.05 kg/m. GENERAL:  Well appearing HEENT: Pupils equal round and reactive, fundi not visualized, oral mucosa unremarkable NECK:  No jugular venous distention, waveform within normal limits, carotid upstroke brisk and symmetric, no bruits LUNGS:  Clear to auscultation bilaterally HEART:  RRR.  PMI not displaced or sustained,S1 and S2 within normal limits, no S3, no S4, no clicks, no rubs, no murmurs ABD:  Flat, positive bowel sounds normal in frequency in pitch, no bruits, no rebound, no guarding, no midline pulsatile mass, no hepatomegaly, no splenomegaly EXT:  2 plus pulses throughout, trace edema, no cyanosis no clubbing SKIN:  No rashes no nodules NEURO:  Cranial nerves II through XII grossly intact, motor grossly intact throughout PSYCH:  Cognitively intact, oriented to person place and time   EKG:  EKG is ordered today. The ekg ordered 10/12/15 demonstrates Sinus tachycardia rate 105 bpm.  low voltage in the limb and precordial leads. 06/21/2018: Sinus rhythm.  99 bpm.  Echo 06/11/18: Study Conclusions  - Left ventricle: The cavity size was normal. Wall thickness was   increased in a pattern of mild LVH. Systolic function was normal.   The estimated ejection fraction  was in the range of 50% to 55%.   Wall motion was normal; there were no regional wall motion   abnormalities. Doppler parameters are consistent with abnormal   left ventricular relaxation (grade 1 diastolic dysfunction).  Impressions:  - Definity used; low normal to mildly reduced LV systolic function   (EF 50); mild LVH; mild diastolic dysfunction.  Echo 03/12/18: Study Conclusions  - Left ventricle: Apical window is foreshoertened some,   particularly in 2 chamber views. LVEF is approximately 30%with   diffuse hypokinesis. In comparison to images from December 2018   there is  no significant change. The cavity size was mildly   dilated. Wall thickness was increased in a pattern of mild LVH.  Echo 10/23/15: Study Conclusions  - Left ventricle: The cavity size was normal. Wall thickness was  increased in a pattern of mild LVH. Systolic function was mildly  to moderately reduced. The estimated ejection fraction was in the  range of 40% to 45%. There is hypokinesis of the  mid-apicalanterolateral myocardium. Doppler parameters are  consistent with abnormal left ventricular relaxation (grade 1  diastolic dysfunction).  LHC/RHC 11/13/17: 1. Large, tortuous coronary arteries without angiographically significant coronary artery disease. 2. Normal left and right heart filling pressures. 3. Normal pulmonary artery pressure. 4. Low normal Fick cardiac index.  RA 6, RV 32/9, PA 26/15, mean PA 19, PCWP 12 .   Recent Labs: 11/09/2017: B Natriuretic Peptide 48.5 11/13/2017: Platelets 233 11/17/2017: Hemoglobin 15.6 04/10/2018: ALT 22; BUN 32; Creatinine, Ser 1.87; Potassium 4.6; Sodium 138   05/29/2018: WBC 6.6, hemoglobin 11.8, hematocrit 38.3, platelets 200 Sodium 140, potassium 4.6, BUN 45, creatinine 1.84 AST 19, ALT 19 Hemoglobin A1c 5.0% Total cholesterol 122, triglycerides 201, HDL 33, LDL 49  Lipid Panel    Component Value Date/Time   CHOL 125 04/10/2018 1040    TRIG 139 04/10/2018 1040   HDL 36 (L) 04/10/2018 1040   CHOLHDL 3.5 04/10/2018 1040   CHOLHDL 5.0 12/18/2010 0425   VLDL 70 (H) 12/18/2010 0425   LDLCALC 61 04/10/2018 1040      Wt Readings from Last 3 Encounters:  06/21/18 278 lb (126.1 kg)  04/10/18 283 lb 3.2 oz (128.5 kg)  01/01/18 295 lb (133.8 kg)     ASSESSMENT AND PLAN:  # Chronic systolic heart failure: Miguel Ho LVEF was reduced to 15% and has now recovered to 50-55%.  He is doing fantastic.  No need for ICD.  Continue carvedilol, Entresto, hydralazine and furosemide.  He was encouraged to keep up his new lifestyle changes.  # Hypertension: Blood pressure is well-controlled.  It is low but he remains asymptomatic.  Continue carvedilol, hydralazine, and Entresto.    # Hyperlipidemia: Continue lovastatin.  LDL 61 03/2018. Triglycerides are much better.  # Morbid obesity: Miguel Ho was congratulated on his weight loss and lifestyle change.  # OSA: Continue CPAP.  Current medicines are reviewed at length with the patient today.  The patient does not have concerns regarding medicines.  The following changes have been made:  no change  Labs/ tests ordered today include:   No orders of the defined types were placed in this encounter.    Disposition:   FU with Laycie Schriner C. Duke Salvia, MD in 6 months.    Signed, Chilton Si, MD  06/21/2018 10:06 AM    [facility]

## 2018-06-27 ENCOUNTER — Encounter: Payer: Self-pay | Admitting: Cardiovascular Disease

## 2018-08-16 ENCOUNTER — Other Ambulatory Visit: Payer: Self-pay | Admitting: Physician Assistant

## 2018-11-23 ENCOUNTER — Encounter: Payer: Self-pay | Admitting: Cardiovascular Disease

## 2018-11-23 ENCOUNTER — Ambulatory Visit (INDEPENDENT_AMBULATORY_CARE_PROVIDER_SITE_OTHER): Payer: Self-pay | Admitting: Cardiovascular Disease

## 2018-11-23 VITALS — BP 122/68 | HR 100 | Ht 69.0 in | Wt 284.0 lb

## 2018-11-23 DIAGNOSIS — I428 Other cardiomyopathies: Secondary | ICD-10-CM

## 2018-11-23 DIAGNOSIS — E785 Hyperlipidemia, unspecified: Secondary | ICD-10-CM

## 2018-11-23 DIAGNOSIS — E1169 Type 2 diabetes mellitus with other specified complication: Secondary | ICD-10-CM

## 2018-11-23 DIAGNOSIS — I10 Essential (primary) hypertension: Secondary | ICD-10-CM

## 2018-11-23 NOTE — Progress Notes (Signed)
Cardiology Office Note   Date:  11/23/2018   ID:  Miguel Ho, DOB [birth year], MRN [id number]  PCP:  Ralene Ok, MD  Cardiologist:   Chilton Si, MD   No chief complaint on file.   History of Present Illness: Miguel Ho is a 58 y.o. male with chronic systolic and diastolic heart failure (resolved), hypertension, hyperlipidemia, OSA, and diabetes who presents for follow up.  He was initially seen in 2017 for management of chronic systolic heart failure.  [name] reports that he was diagnosed with heart failure in the early 2000s. He reports undergoing cardiac catheterization that did not reveal any significant blockages.  He followed up with a cardiologist for short period of time but had not been seen in several years.  He had an echo 12/16 that revealed LVEF 40-45% and hypokinesis of the mid-apical anterolateral myocardium.  There was grade 1 diastolic dysfunction.  Overall [name] has been doing well.  He has noted some LE edema after eating certain foods.  [name] has a CPAP but uses it off/on.  He was admitted 10/2017 with acute on chronic heart failure.  Echo at that time revealed a reduction in his LVEF to 15 to 20%.  He was diuresed and started on Entresto.  Left heart catheterization revealed no coronary artery disease.  He followed up with Theodore Demark, PA, and was referred for repeat echo 03/12/18 that showed some improvement in his LVEF to 30%.  He was started on Entresto, carvedilol and hydralazine.  Repeat echo 05/2018 showed an improvement in his EF to 50 to 55%.  Since his last appointment [name] has been doing well.  His only complaint is that he sometimes has numbness in his feet.  He has no chest pain or shortness of breath.  He has been working to avoid sweets in his diet.  He mostly cooks at home.  He has very rare lower extremity edema that improves with elevation.  He denies orthopnea or PND.  He has not been getting much exercise  lately.  He does have a stationary bike at home that he uses sometimes and he tries to park farther away from stores to increase his walking.  He has no exertional symptoms.  Overall he is doing well.   Past Medical History:  Diagnosis Date  . Childhood asthma   . Chronic combined systolic and diastolic heart failure (HCC) 10/12/2015  . Diabetic peripheral neuropathy (HCC)   . Essential hypertension 10/12/2015  . GERD (gastroesophageal reflux disease)   . Gout    "on daily RX" (11/09/2017)  . Hyperlipidemia 10/12/2015  . Morbid obesity (HCC) 10/12/2015  . OSA on CPAP   . Type II diabetes mellitus (HCC)     Past Surgical History:  Procedure Laterality Date  . CARDIAC CATHETERIZATION  2003   nl cors, Dr Elsie Lincoln  . RIGHT/LEFT HEART CATH AND CORONARY ANGIOGRAPHY N/A 11/13/2017   Procedure: RIGHT/LEFT HEART CATH AND CORONARY ANGIOGRAPHY;  Surgeon: [name], MD;  Location: [facility];  Service: Cardiovascular;  Laterality: N/A;  . TONSILLECTOMY       Current Outpatient Medications  Medication Sig Dispense Refill  . allopurinol (ZYLOPRIM) 300 MG tablet Take 300 mg by mouth daily.      Marland Kitchen aspirin EC 81 MG tablet Take 81 mg by mouth daily.      . carvedilol (COREG) 25 MG tablet Take 25 mg by mouth 2 (two) times daily with a meal.    .  ENTRESTO 24-26 MG TAKE 1 TABLET BY MOUTH TWICE A DAY 180 tablet 3  . furosemide (LASIX) 40 MG tablet Take 40 mg by mouth daily.      Marland Kitchen. gabapentin (NEURONTIN) 300 MG capsule Take 300 mg by mouth at bedtime.  1  . hydrALAZINE (APRESOLINE) 50 MG tablet TAKE 1 TABLET BY MOUTH THREE TIMES A DAY 270 tablet 1  . LEVEMIR FLEXTOUCH 100 UNIT/ML Pen Inject 70 Units into the skin daily.   4  . lovastatin (MEVACOR) 40 MG tablet Take 40 mg by mouth at bedtime.      . metFORMIN (GLUCOPHAGE) 500 MG tablet Take 500 mg by mouth 2 (two) times daily with a meal.    . OZEMPIC 1 MG/DOSE SOPN Inject 1 mg as directed once a week.    . potassium chloride SA  (K-DUR,KLOR-CON) 20 MEQ tablet Take 20 mEq by mouth daily.      No current facility-administered medications for this visit.     Allergies:   Patient has no known allergies.    Social History:  The patient  reports that he has never smoked. He has never used smokeless tobacco. He reports current alcohol use. He reports that he does not use drugs.   Family History:  The patient's family history includes Cancer in his sister; Diabetes in his father; Heart attack in his father, mother, and paternal grandmother; Heart disease in his father; Hypertension in his father.    ROS:  Please see the history of present illness.   Otherwise, review of systems are positive for none.   All other systems are reviewed and negative.    PHYSICAL EXAM: VS:  BP 122/68   Pulse 100   Ht 5\' 9"  (1.753 m)   Wt 284 lb (128.8 kg)   BMI 41.94 kg/m  , BMI Body mass index is 41.94 kg/m. GENERAL:  Well appearing HEENT: Pupils equal round and reactive, fundi not visualized, oral mucosa unremarkable NECK:  No jugular venous distention, waveform within normal limits, carotid upstroke brisk and symmetric, no bruits LUNGS:  Clear to auscultation bilaterally HEART:  RRR.  PMI not displaced or sustained,S1 and S2 within normal limits, no S3, no S4, no clicks, no rubs, no murmurs ABD:  Flat, positive bowel sounds normal in frequency in pitch, no bruits, no rebound, no guarding, no midline pulsatile mass, no hepatomegaly, no splenomegaly EXT:  2 plus pulses throughout, no edema, no cyanosis no clubbing SKIN:  No rashes no nodules NEURO:  Cranial nerves II through XII grossly intact, motor grossly intact throughout PSYCH:  Cognitively intact, oriented to person place and time   EKG:  EKG is not ordered today. The ekg ordered 10/12/15 demonstrates Sinus tachycardia rate 105 bpm.  low voltage in the limb and precordial leads. 06/21/2018: Sinus rhythm.  99 bpm.  Echo 06/11/18: Study Conclusions  - Left ventricle: The  cavity size was normal. Wall thickness was   increased in a pattern of mild LVH. Systolic function was normal.   The estimated ejection fraction was in the range of 50% to 55%.   Wall motion was normal; there were no regional wall motion   abnormalities. Doppler parameters are consistent with abnormal   left ventricular relaxation (grade 1 diastolic dysfunction).  Impressions:  - Definity used; low normal to mildly reduced LV systolic function   (EF 50); mild LVH; mild diastolic dysfunction.  Echo 03/12/18: Study Conclusions  - Left ventricle: Apical window is foreshoertened some,   particularly in 2  chamber views. LVEF is approximately 30%with   diffuse hypokinesis. In comparison to images from December 2018   there is no significant change. The cavity size was mildly   dilated. Wall thickness was increased in a pattern of mild LVH.  Echo 10/23/15: Study Conclusions  - Left ventricle: The cavity size was normal. Wall thickness was  increased in a pattern of mild LVH. Systolic function was mildly  to moderately reduced. The estimated ejection fraction was in the  range of 40% to 45%. There is hypokinesis of the  mid-apicalanterolateral myocardium. Doppler parameters are  consistent with abnormal left ventricular relaxation (grade 1  diastolic dysfunction).  LHC/RHC 11/13/17: 1. Large, tortuous coronary arteries without angiographically significant coronary artery disease. 2. Normal left and right heart filling pressures. 3. Normal pulmonary artery pressure. 4. Low normal Fick cardiac index.  RA 6, RV 32/9, PA 26/15, mean PA 19, PCWP 12 .   Recent Labs: 04/10/2018: ALT 22; BUN 32; Creatinine, Ser 1.87; Potassium 4.6; Sodium 138   05/29/2018: WBC 6.6, hemoglobin 11.8, hematocrit 38.3, platelets 200 Sodium 140, potassium 4.6, BUN 45, creatinine 1.84 AST 19, ALT 19 Hemoglobin A1c 5.0% Total cholesterol 122, triglycerides 201, HDL 33, LDL 49  Lipid Panel      Component Value Date/Time   CHOL 125 04/10/2018 1040   TRIG 139 04/10/2018 1040   HDL 36 (L) 04/10/2018 1040   CHOLHDL 3.5 04/10/2018 1040   CHOLHDL 5.0 12/18/2010 0425   VLDL 70 (H) 12/18/2010 0425   LDLCALC 61 04/10/2018 1040      Wt Readings from Last 3 Encounters:  11/23/18 284 lb (128.8 kg)  06/21/18 278 lb (126.1 kg)  04/10/18 283 lb 3.2 oz (128.5 kg)     ASSESSMENT AND PLAN:  # Chronic systolic heart failure: Mr. Pavao LVEF was reduced to 15% and has now recovered to 50-55%.  He is euvolemic on exam.  No need for ICD.  Continue carvedilol, Entresto, hydralazine and furosemide.  He was encouraged to keep up his new lifestyle changes.   # Hypertension: Blood pressure is well-controlled.  Continue carvedilol, hydralazine and Entresto.  # Hyperlipidemia: Continue lovastatin.  LDL 61 03/2018. Triglycerides are much better.  Repeat lipids in the spring.  # Morbid obesity: Mr. Waymire was congratulated on his weight loss and lifestyle change.  He was encouraged to increase his exercise to 150 minutes per week as he is gaining weight back.  # OSA: Continue CPAP.  Current medicines are reviewed at length with the patient today.  The patient does not have concerns regarding medicines.  The following changes have been made:  no change  Labs/ tests ordered today include:   No orders of the defined types were placed in this encounter.    Disposition:   FU with Tyresa Prindiville C. Duke Salvia, MD in 1 year.  APP in 6 months.     Signed, Chilton Si, MD  11/23/2018 9:11 AM    [facility]

## 2018-11-23 NOTE — Patient Instructions (Addendum)
Medication Instructions:  Your physician recommends that you continue on your current medications as directed. Please refer to the Current Medication list given to you today.  If you need a refill on your cardiac medications before your next appointment, please call your pharmacy.   Lab work: NONE  Testing/Procedures: NONE  Follow-Up: At [facility], you and your health needs are our priority. As part of our continuing mission to provide you with exceptional heart care, we have created designated Provider Care Teams. These Care Teams include your primary Cardiologist (physician) and Advanced Practice Providers (APPs -  Physician Assistants and Nurse Practitioners) who all work together to provide you with the care you need, when you need it.  You will need a follow up appointment in 6 months.  Please call our office 2-3 months in advance to schedule this appointment.  You may see  or one of the following Advanced Practice Providers on your designated Care Team:    Corine Shelter, [place]  Judy Pimple, PA-C  Marjie Skiff, [place]  Your physician wants you to follow-up in: 1 YEAR WITH DR [facility]  Please call our office to schedule the follow-up appointment 2-3 MONTHS IN ADVANCE

## 2019-02-11 ENCOUNTER — Other Ambulatory Visit: Payer: Self-pay | Admitting: Cardiovascular Disease

## 2019-02-16 ENCOUNTER — Other Ambulatory Visit: Payer: Self-pay | Admitting: Cardiovascular Disease

## 2019-02-18 NOTE — Telephone Encounter (Signed)
Carvedilol refilled.

## 2019-03-06 ENCOUNTER — Other Ambulatory Visit: Payer: Self-pay | Admitting: Nephrology

## 2019-03-06 DIAGNOSIS — N183 Chronic kidney disease, stage 3 unspecified: Secondary | ICD-10-CM

## 2019-03-12 ENCOUNTER — Other Ambulatory Visit: Payer: Self-pay

## 2019-03-12 ENCOUNTER — Ambulatory Visit
Admission: RE | Admit: 2019-03-12 | Discharge: 2019-03-12 | Disposition: A | Discharge status: Home / Self Care | Payer: Managed Care, Other (non HMO) | Source: Ambulatory Visit | Attending: Nephrology | Admitting: Nephrology

## 2019-03-12 DIAGNOSIS — N183 Chronic kidney disease, stage 3 unspecified: Secondary | ICD-10-CM

## 2019-04-05 ENCOUNTER — Encounter: Payer: Managed Care, Other (non HMO) | Attending: Nephrology | Admitting: Dietician

## 2019-04-05 ENCOUNTER — Other Ambulatory Visit: Payer: Self-pay

## 2019-04-05 ENCOUNTER — Encounter: Payer: Self-pay | Admitting: Dietician

## 2019-04-05 DIAGNOSIS — N183 Chronic kidney disease, stage 3 unspecified: Secondary | ICD-10-CM

## 2019-04-05 DIAGNOSIS — Z794 Long term (current) use of insulin: Secondary | ICD-10-CM | POA: Diagnosis present

## 2019-04-05 DIAGNOSIS — E0800 Diabetes mellitus due to underlying condition with hyperosmolarity without nonketotic hyperglycemic-hyperosmolar coma (NKHHC): Secondary | ICD-10-CM | POA: Diagnosis present

## 2019-04-05 NOTE — Patient Instructions (Signed)
Resources:  [facility].com  Kidney.org Follow a low sodium diet Be mindful to avoid foods with added Phosphorous "phos" increadients Avoid Potassium salt substitutes such as Lite salt and NO salt products.  Read labels for seasonings to avoid Potassium that has been added. Meat portion the size of a deck of cards. Choose lean meat and prepare without added fat. Avoid skipping meals Spread your carbohydrates throughout the day.  Choose less processed carbs.  Find time to include exercise most days of the week.    What do you enjoy?  How can you improve your motivation?  Consistency and habit "You are capable!"  Goal is habit change!

## 2019-04-05 NOTE — Progress Notes (Signed)
Medical Nutrition Therapy:  Appt start time: 1500 end time:  1600.   Assessment:  Primary concerns today: Patient is here today alone to discuss how to help diabetes control and kidney function as well as losing weight.  He would like to lose to 200 lbs. He was referred for CKD stage 3 with proteinuria, type 2 diabetes and other history which includes HLD, HTN, GERD, OSA on C-pap, morbid obesity.  Medications include:  Levemir 7- units q am, metformin, and ozempic  Last A1C 6% (approximately per patient)  Other labs include   eGFR 49, Potassium 4.7, BUN 27, Creatinine 1.7  BG monitoring most every day around 120-125 fasting and increases to 150 with fresh squeezed juice.  Weight hx: 279 lbs 281 lbs 03/06/19 324 lbs "years ago" and lost to 235 lbs He lost weight with increased walking and other exercise and eating a low fat diet.   185 was his lowest adult weight in his early 20's Patient lives with his wife and 41 yo daughter.  Patient does most of the shopping and cooking.  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  Preferred Learning Style:   No preference indicated   Learning Readiness:   Ready  Change in progress   DIETARY INTAKE: States that he does not have the appetite that he used to have.  Shops at [employer].  Uses Mrs. Dash, garlic salt.  Lactose intolerant.  Red meat does not tolerate a lot of red meat due to the gout 24-hr recall:  B ( AM): "not hungry" sometimes toast, squeeze an orange lemon  Snk ( AM):  none L ( PM): leftovers Snk ( PM): none D ( PM): non starchy vegetables, steak or chicken, sometimes mashed potatoes OR salad without meat with french dressing Snk ( PM): rare orange Beverages:  alkaline water, squeeze of lemon or orange (8 ounces per day), occasional unsweetened tea  Usual physical activity: yard work, occasional treadmill or stationary bike, walking in neighborhood but not much recently   Estimated energy  needs: 1800 calories 75-80 g protein  Progress Towards Goal(s):  In progress.   Nutritional Diagnosis:  NB-1.1 Food and nutrition-related knowledge deficit As related to nutrition for type 2 diabetes and CKD.  As evidenced by diet hx and patient report.    Intervention:  Nutrition counseling/education related to guidelines and balanced meals for CKD and type 2 Diabetes.  Discussed the benefits of exercise.  Discussed risks of uncontrolled HTN, DM.  Discussed resources. Importance of balanced meals.  Low sodium, low fat, low animal protein.  Resources:  [facility].com  Kidney.org Follow a low sodium diet Be mindful to avoid foods with added Phosphorous "phos" increadients Avoid Potassium salt substitutes such as Lite salt and NO salt products.  Read labels for seasonings to avoid Potassium that has been added. Meat portion the size of a deck of cards. Choose lean meat and prepare without added fat. Avoid skipping meals Spread your carbohydrates throughout the day.  Choose less processed carbs.  Find time to include exercise most days of the week.    What do you enjoy?  How can you improve your motivation?  Consistency and habit "You are capable!"  Goal is habit change!   Teaching Method Utilized:  Visual Auditory Hands on  Handouts given during visit include:  Kidney pate  Meal Plan Card  Diabetes Resources  Barriers to learning/adherence to lifestyle change: working from home  Demonstrated degree of understanding  via:  Teach Back   Monitoring/Evaluation:  Dietary intake, exercise, and body weight prn.

## 2019-06-13 ENCOUNTER — Telehealth: Payer: Self-pay | Admitting: Adult Health

## 2019-06-13 ENCOUNTER — Telehealth: Payer: Self-pay | Admitting: *Deleted

## 2019-06-13 ENCOUNTER — Encounter: Payer: Managed Care, Other (non HMO) | Admitting: Adult Health

## 2019-06-13 ENCOUNTER — Encounter: Payer: Self-pay | Admitting: Adult Health

## 2019-06-13 NOTE — Telephone Encounter (Signed)
Call pt about his virtual appt, pt was able to give me his vital sign and we went over his medications, pt stated he was at a dentist appt and want me to call him back around 11:45, I call pt back several time with no answer, as per Jory Sims if pt didn't answer by 12 pm, pt need to reschedule appt.

## 2019-06-13 NOTE — Telephone Encounter (Addendum)
Error - Erroneous Encounter 

## 2019-06-13 NOTE — Progress Notes (Signed)
Error,No Show

## 2019-06-14 ENCOUNTER — Telehealth: Payer: Self-pay

## 2019-06-14 NOTE — Telephone Encounter (Signed)
YOUR CARDIOLOGY TEAM HAS ARRANGED FOR AN E-VISIT FOR YOUR APPOINTMENT - PLEASE REVIEW IMPORTANT INFORMATION BELOW SEVERAL DAYS PRIOR TO YOUR APPOINTMENT ° °Due to the recent COVID-19 pandemic, we are transitioning in-person office visits to tele-medicine visits in an effort to decrease unnecessary exposure to our patients, their families, and staff. These visits are billed to your insurance just like a normal visit is. We also encourage you to sign up for MyChart if you have not already done so. You will need a smartphone if possible. For patients that do not have this, we can still complete the visit using a regular telephone but do prefer a smartphone to enable video when possible. You may have a family member that lives with you that can help. If possible, we also ask that you have a blood pressure cuff and scale at home to measure your blood pressure, heart rate and weight prior to your scheduled appointment. Patients with clinical needs that need an in-person evaluation and testing will still be able to come to the office if absolutely necessary. If you have any questions, feel free to call our office. ° °• IF USING MYCHART - How to Download the MyChart App to Your SmartPhone  ° °- If Apple, go to App Store and type in MyChart in the search bar and download the app. If Android, ask patient to go to Google Play Store and type in MyChart in the search bar and download the app. The app is free but as with any other app downloads, your phone may require you to verify saved payment information or Apple/Android password.  °- You will need to then log into the app with your MyChart username and password, and select [facility] as your healthcare provider to link the account.  °- When it is time for your visit, go to the MyChart app, find appointments, and click Begin Video Visit. Be sure to Select Allow for your device to access the Microphone and Camera for your visit. You will then be connected, and your provider  will be with you shortly. ° **If you have any issues connecting or need assistance, please contact MyChart service desk [phone] ([phone])** ° **If using a computer, in order to ensure the best quality for your visit, you will need to use either of the following Internet Browsers: Google Chrome or Microsoft Edge** ° °• IF USING DOXIMITY or [email or web address] - The staff will give you instructions on receiving your link to join the meeting the day of your visit.  ° ° ° ° °2-3 DAYS BEFORE YOUR APPOINTMENT ° °You will receive a telephone call from one of our [facility] team members - your caller ID may say "Unknown caller." If this is a video visit, we will walk you through how to get the video launched on your phone. We will remind you check your blood pressure, heart rate and weight prior to your scheduled appointment. If you have an Apple Watch or Kardia, please upload any pertinent ECG strips the day before or morning of your appointment to MyChart. Our staff will also make sure you have reviewed the consent and agree to move forward with your scheduled tele-health visit.  ° ° ° °THE DAY OF YOUR APPOINTMENT ° °Approximately 15 minutes prior to your scheduled appointment, you will receive a telephone call from one of HeartCare team - your caller ID may say "Unknown caller."  Our staff will confirm medications, vital signs for the day and any symptoms you may be experiencing.   Please have this information available prior to the time of visit start. It may also be helpful for you to have a pad of paper and pen handy for any instructions given during your visit. They will also walk you through joining the smartphone meeting if this is a video visit. ° ° ° °CONSENT FOR TELE-HEALTH VISIT - PLEASE REVIEW ° °I hereby voluntarily request, consent and authorize [facility] and its employed or contracted physicians, physician assistants, nurse practitioners or other licensed health care professionals (the Practitioner), to  provide me with telemedicine health care services (the “Services") as deemed necessary by the treating Practitioner. I acknowledge and consent to receive the Services by the Practitioner via telemedicine. I understand that the telemedicine visit will involve communicating with the Practitioner through live audiovisual communication technology and the disclosure of certain medical information by electronic transmission. I acknowledge that I have been given the opportunity to request an in-person assessment or other available alternative prior to the telemedicine visit and am voluntarily participating in the telemedicine visit. ° °I understand that I have the right to withhold or withdraw my consent to the use of telemedicine in the course of my care at any time, without affecting my right to future care or treatment, and that the Practitioner or I may terminate the telemedicine visit at any time. I understand that I have the right to inspect all information obtained and/or recorded in the course of the telemedicine visit and may receive copies of available information for a reasonable fee.  I understand that some of the potential risks of receiving the Services via telemedicine include:  °• Delay or interruption in medical evaluation due to technological equipment failure or disruption; °• Information transmitted may not be sufficient (e.g. poor resolution of images) to allow for appropriate medical decision making by the Practitioner; and/or  °• In rare instances, security protocols could fail, causing a breach of personal health information. ° °Furthermore, I acknowledge that it is my responsibility to provide information about my medical history, conditions and care that is complete and accurate to the best of my ability. I acknowledge that Practitioner's advice, recommendations, and/or decision may be based on factors not within their control, such as incomplete or inaccurate data provided by me or distortions of  diagnostic images or specimens that may result from electronic transmissions. I understand that the practice of medicine is not an exact science and that Practitioner makes no warranties or guarantees regarding treatment outcomes. I acknowledge that I will receive a copy of this consent concurrently upon execution via email to the email address I last provided but may also request a printed copy by calling the office of [facility].   ° °I understand that my insurance will be billed for this visit.  ° °I have read or had this consent read to me. °• I understand the contents of this consent, which adequately explains the benefits and risks of the Services being provided via telemedicine.  °• I have been provided ample opportunity to ask questions regarding this consent and the Services and have had my questions answered to my satisfaction. °• I give my informed consent for the services to be provided through the use of telemedicine in my medical care ° °By participating in this telemedicine visit I agree to the above. ° °

## 2019-06-16 NOTE — Progress Notes (Signed)
Patient was no-show

## 2019-06-16 NOTE — Progress Notes (Signed)
Telehealth Video Patient has given verbal permission to conduct this visit via virtual appointment and to bill insurance 06/20/2019 8:25 am      Date:  06/20/2019   ID:  Miguel Ho, DOB 10/08/1961, MRN [id number] Patient location: Home Provider location: Home office  PCP:  Miguel Ok, MD  Cardiologist:  Miguel Si, MD Electrophysiologist:  None   Evaluation Performed: Follow-up hypertension CHF  Chief Complaint: Follow-up  History of Present Illness:    Miguel Ho is a 58 y.o. male for ongoing assessment and management of hypertension, chronic mixed systolic diastolic heart failure, hyperlipidemia, with other history of OSA, mixed compliance with CPAP, and diabetes.  Patient has had a history of mixed CHF since early 2000.  Per Dr. Leonides Sake note the patient reported having a cardiac catheterization that did not reveal any significant blockages.  Most recent echocardiogram was completed in December 2016 revealing an EF of 40% to 45% with hypokinesis of the mid and apical anterior lateral myocardium.  Patient had grade 1 diastolic dysfunction.  It was noted that Miguel Ho was admitted to the hospital in December 2018 with acute on chronic CHF and echocardiogram revealed an EF of 15% to 20%.  He was started on Entresto and diuresis.  Cardiac catheterization at that time revealed no coronary artery disease.  Repeat echocardiogram 3 months later revealed improvement in EF to 30%.  He was continued on Entresto carvedilol and hydralazine.  Another follow-up echo in July 2019 showed improvement of EF to 50% to 55%.  Miguel Ho is doing well. He is maintaining his weight, actually losing a few pounds (-7 pounds).. Very mindful of what he is eating. He is medically compliant. He exercises daily within the limits of the weather and heat.  He is seeing his dentist, ophthalmologist, and primary care physician regularly.  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  He states that he is doing well and has no concerns at this time.  The patient does not have symptoms concerning for COVID-19 infection (fever, chills, cough, or new shortness of breath).    Past Medical History:  Diagnosis Date  . Childhood asthma   . Chronic combined systolic and diastolic heart failure (HCC) 10/12/2015  . Diabetic peripheral neuropathy (HCC)   . Essential hypertension 10/12/2015  . GERD (gastroesophageal reflux disease)   . Gout    "on daily RX" (11/09/2017)  . Hyperlipidemia 10/12/2015  . Morbid obesity (HCC) 10/12/2015  . OSA on CPAP   . Type II diabetes mellitus (HCC)    Past Surgical History:  Procedure Laterality Date  . CARDIAC CATHETERIZATION  2003   nl cors, Dr Elsie Lincoln  . RIGHT/LEFT HEART CATH AND CORONARY ANGIOGRAPHY N/A 11/13/2017   Procedure: RIGHT/LEFT HEART CATH AND CORONARY ANGIOGRAPHY;  Surgeon: [name], MD;  Location: [facility];  Service: Cardiovascular;  Laterality: N/A;  . TONSILLECTOMY       Current Meds  Medication Sig  . allopurinol (ZYLOPRIM) 300 MG tablet Take 300 mg by mouth daily.    Marland Kitchen aspirin EC 81 MG tablet Take 81 mg by mouth daily.    . carvedilol (COREG) 25 MG tablet TAKE 1 TABLET BY MOUTH TWICE A DAY  . ENTRESTO 24-26 MG TAKE 1 TABLET BY MOUTH TWICE A DAY  . furosemide (LASIX) 40 MG tablet Take 40 mg by mouth daily.    Marland Kitchen gabapentin (NEURONTIN) 300 MG capsule Take  300 mg by mouth at bedtime.  . hydrALAZINE (APRESOLINE) 50 MG tablet TAKE 1 TABLET BY MOUTH THREE TIMES A DAY  . LEVEMIR FLEXTOUCH 100 UNIT/ML Pen Inject 70 Units into the skin daily.   Marland Kitchen. lovastatin (MEVACOR) 40 MG tablet Take 40 mg by mouth at bedtime.    . metFORMIN (GLUCOPHAGE) 500 MG tablet Take 500 mg by mouth 2 (two) times daily with a meal.  . OZEMPIC 1 MG/DOSE SOPN Inject 1 mg as directed once a week.  . potassium chloride SA (K-DUR,KLOR-CON) 20 MEQ tablet Take 20 mEq by mouth daily.       Allergies:   Patient has no known allergies.   Social History   Tobacco Use  . Smoking status: Never Smoker  . Smokeless tobacco: Never Used  Substance Use Topics  . Alcohol use: Yes    Comment: 11/09/2017 "on major holidays I might have a drink; last drink was 02/2017"  . Drug use: No     Family Hx: The patient's family history includes Cancer in his sister; Diabetes in his father; Heart attack in his father, mother, and paternal grandmother; Heart disease in his father; Hypertension in his father.  ROS:   Please see the history of present illness.    All other systems reviewed and are negative.   Prior CV studies:   The following studies were reviewed today: Echo 06/11/18: Study Conclusions  - Left ventricle: The cavity size was normal. Wall thickness was increased in a pattern of mild LVH. Systolic function was normal. The estimated ejection fraction was in the range of 50% to 55%. Wall motion was normal; there were no regional wall motion abnormalities. Doppler parameters are consistent with abnormal left ventricular relaxation (grade 1 diastolic dysfunction).  Impressions:  - Definity used; low normal to mildly reduced LV systolic function (EF 50); mild LVH; mild diastolic dysfunction.  Echo 03/12/18: Study Conclusions  - Left ventricle: Apical window is foreshoertened some, particularly in 2 chamber views. LVEF is approximately 30%with diffuse hypokinesis. In comparison to images from December 2018 there is no significant change. The cavity size was mildly dilated. Wall thickness was increased in a pattern of mild LVH.  Echo 10/23/15: Study Conclusions  - Left ventricle: The cavity size was normal. Wall thickness was  increased in a pattern of mild LVH. Systolic function was mildly  to moderately reduced. The estimated ejection fraction was in the  range of 40% to 45%. There is hypokinesis of the  mid-apicalanterolateral  myocardium. Doppler parameters are  consistent with abnormal left ventricular relaxation (grade 1  diastolic dysfunction).  LHC/RHC 11/13/17: 1. Large, tortuous coronary arteries without angiographically significant coronary artery disease. 2. Normal left and right heart filling pressures. 3. Normal pulmonary artery pressure. 4. Low normal Fick cardiac index.  RA 6, RV 32/9, PA 26/15, mean PA 19, PCWP 12  Labs/Other Tests and Data Reviewed:    EKG: Not completed due to video visit.  Recent Labs: No results found for requested labs within last 8760 hours.   Recent Lipid Panel Lab Results  Component Value Date/Time   CHOL 125 04/10/2018 10:40 AM   TRIG 139 04/10/2018 10:40 AM   HDL 36 (L) 04/10/2018 10:40 AM   CHOLHDL 3.5 04/10/2018 10:40 AM   CHOLHDL 5.0 12/18/2010 04:25 AM   LDLCALC 61 04/10/2018 10:40 AM    Wt Readings from Last 3 Encounters:  06/20/19 271 lb (122.9 kg)  06/13/19 273 lb (123.8 kg)  04/05/19 279 lb (126.6 kg)  Objective:    Vital Signs:  Ht 5\' 9"  (1.753 m)   Wt 271 lb (122.9 kg)   BMI 40.02 kg/m    General: Awake alert oriented no acute distress Respirations: Normal inspiratory expiratory breathing, no cough, no wheezing auscultated during conversation. Musculoskeletal: No deficits noted. Neuro: No focal deficits  ASSESSMENT & PLAN:    1.  Hypertension: Medically compliant.  Blood pressure has been running in the 130s to 140s when seen by primary care.  He does not have a blood pressure machine at home.  He is advised to have one for his own surveillance of blood pressure away from healthcare providers office.  The patient is medically compliant.  He does not require refills at this time.  He has had recent labs by PCP.  2.  History mixed CHF: Most recent echocardiogram revealed normalization of LV systolic function with grade 1 diastolic dysfunction.  The patient is maintaining his weight avoiding salt and does not have any complaints of  chronic lower extremity edema or dyspnea.  No changes in his medication regimen.  3.  Hyperlipidemia: Followed by PCP.  Recent labs are completed.  COVID-19 Education: The signs and symptoms of COVID-19 were discussed with the patient and how to seek care for testing (follow up with PCP or arrange E-visit).  The importance of social distancing was discussed today.  Time:   Today, I have spent 15 minutes with the patient with telehealth technology discussing the above problems.     Medication Adjustments/Labs and Tests Ordered: Current medicines are reviewed at length with the patient today.  Concerns regarding medicines are outlined above.   Tests Ordered: No orders of the defined types were placed in this encounter.   Medication Changes: No orders of the defined types were placed in this encounter.   Disposition:  Follow up 6 months with Dr. Oval Linsey  Signed, Phill Myron. West Pugh, ANP, AACC  06/20/2019 8:44 AM    [facility]

## 2019-06-20 ENCOUNTER — Encounter: Payer: Self-pay | Admitting: Adult Health

## 2019-06-20 ENCOUNTER — Encounter (INDEPENDENT_AMBULATORY_CARE_PROVIDER_SITE_OTHER): Payer: Managed Care, Other (non HMO) | Admitting: Adult Health

## 2019-06-20 VITALS — Ht 69.0 in | Wt 271.0 lb

## 2019-06-20 DIAGNOSIS — I5042 Chronic combined systolic (congestive) and diastolic (congestive) heart failure: Secondary | ICD-10-CM | POA: Diagnosis not present

## 2019-06-20 DIAGNOSIS — I10 Essential (primary) hypertension: Secondary | ICD-10-CM | POA: Diagnosis not present

## 2019-06-20 DIAGNOSIS — E7800 Pure hypercholesterolemia, unspecified: Secondary | ICD-10-CM | POA: Diagnosis not present

## 2019-06-20 NOTE — Patient Instructions (Signed)
Medication Instructions:  Continue current medications  If you need a refill on your cardiac medications before your next appointment, please call your pharmacy.  Labwork: None Ordered   Testing/Procedures: None Ordered  Follow-Up: You will need a follow up appointment in 6 months.  Please call our office 2 months in advance to schedule this appointment.  You may see Tiffany , MD or one of the following Advanced Practice Providers on your designated Care Team:   Luke Kilroy, PA-C Krista Kroeger, PA-C . Callie Goodrich, PA-C     At [facility], you and your health needs are our priority.  As part of our continuing mission to provide you with exceptional heart care, we have created designated Provider Care Teams.  These Care Teams include your primary Cardiologist (physician) and Advanced Practice Providers (APPs -  Physician Assistants and Nurse Practitioners) who all work together to provide you with the care you need, when you need it.  Thank you for choosing [facility]!!     

## 2019-06-20 NOTE — Addendum Note (Signed)
Addended by: Lendon Colonel on: 06/20/2019 10:14 AM   Modules accepted: Level of Service

## 2019-07-17 IMAGING — CR DG CHEST 2V
2 series · 2 of 2 positions shown · non-contrast
Comparison: 11/09/2017

CLINICAL DATA: Left side chest tightness, shortness of Breath

EXAM:
CHEST  2 VIEW

[chest pa]
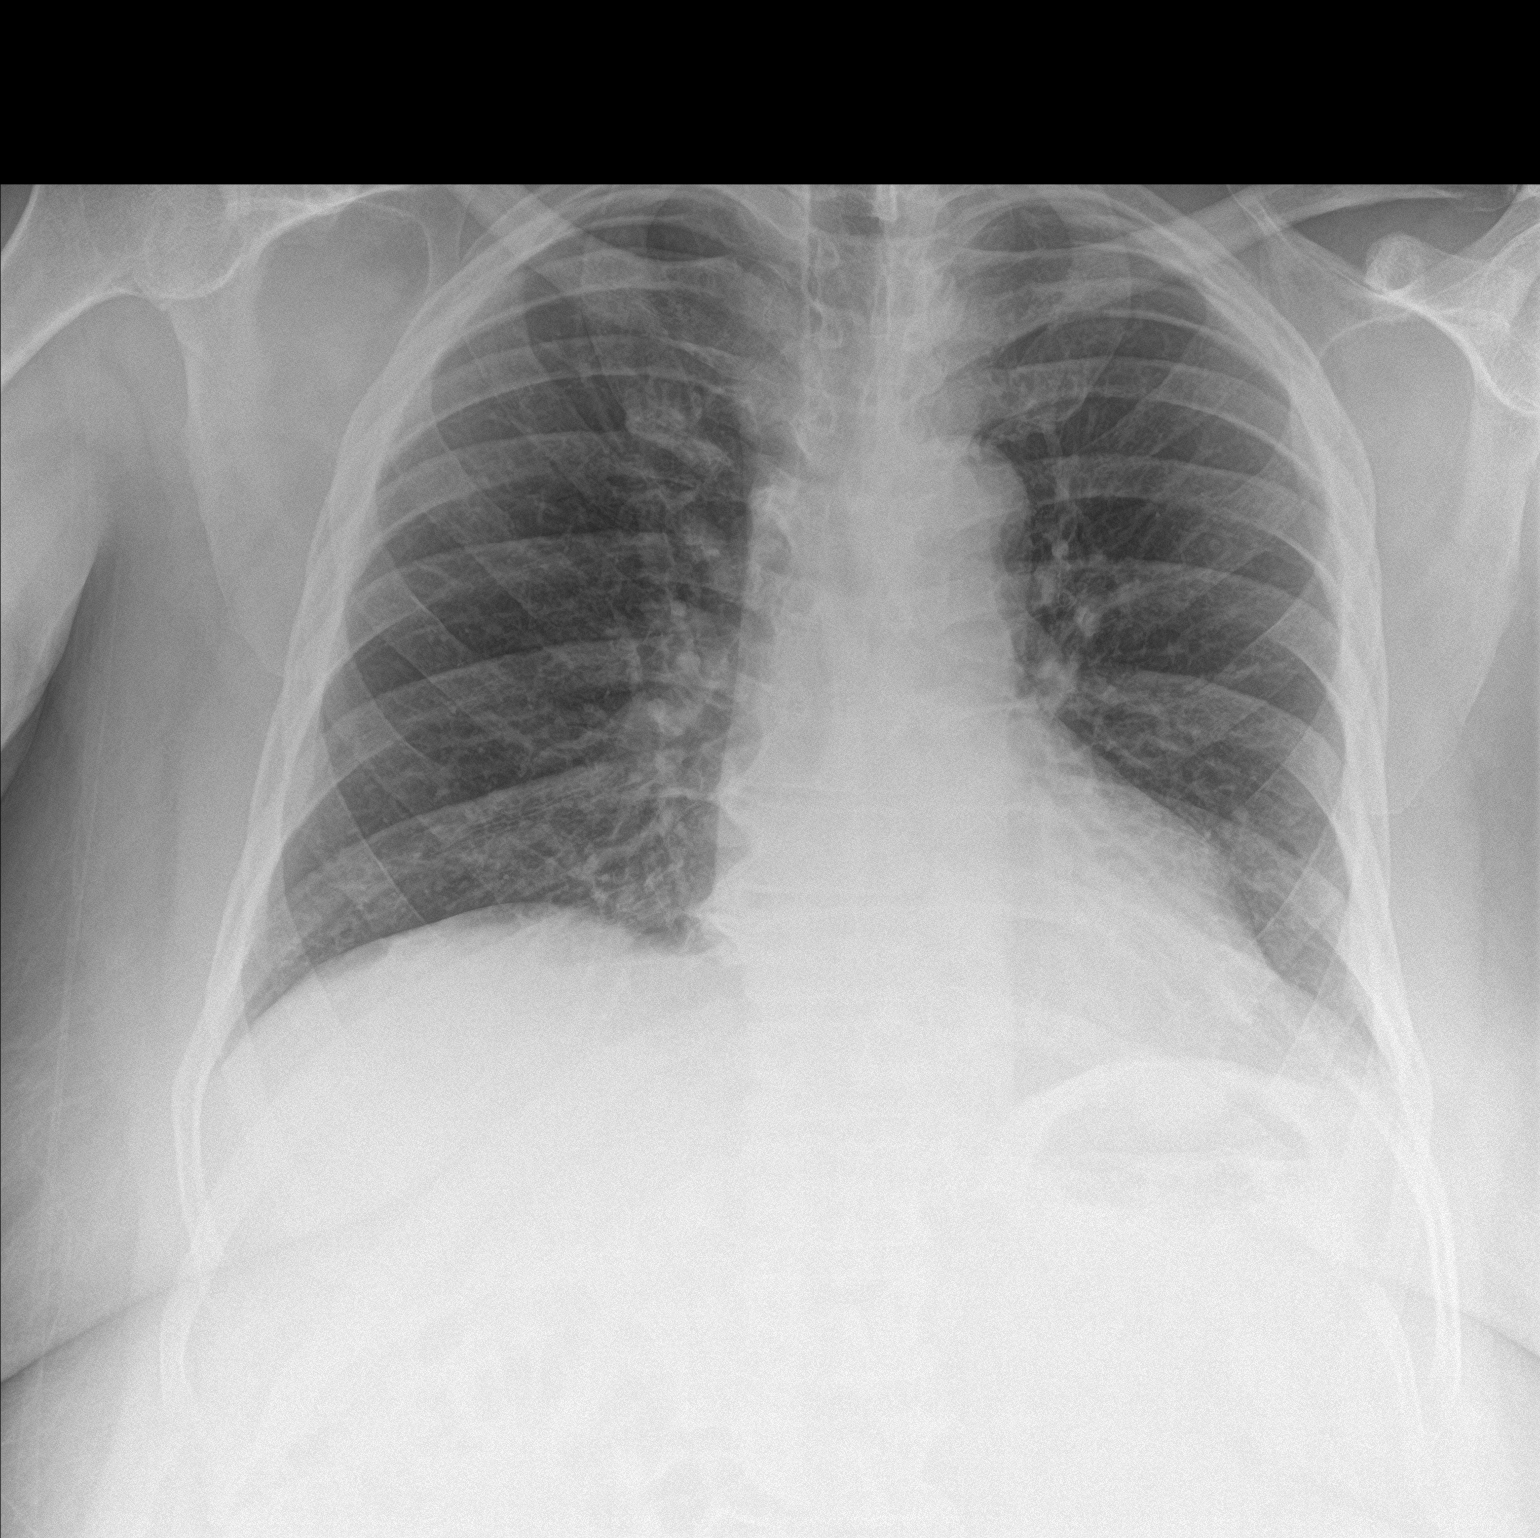

[chest lat]
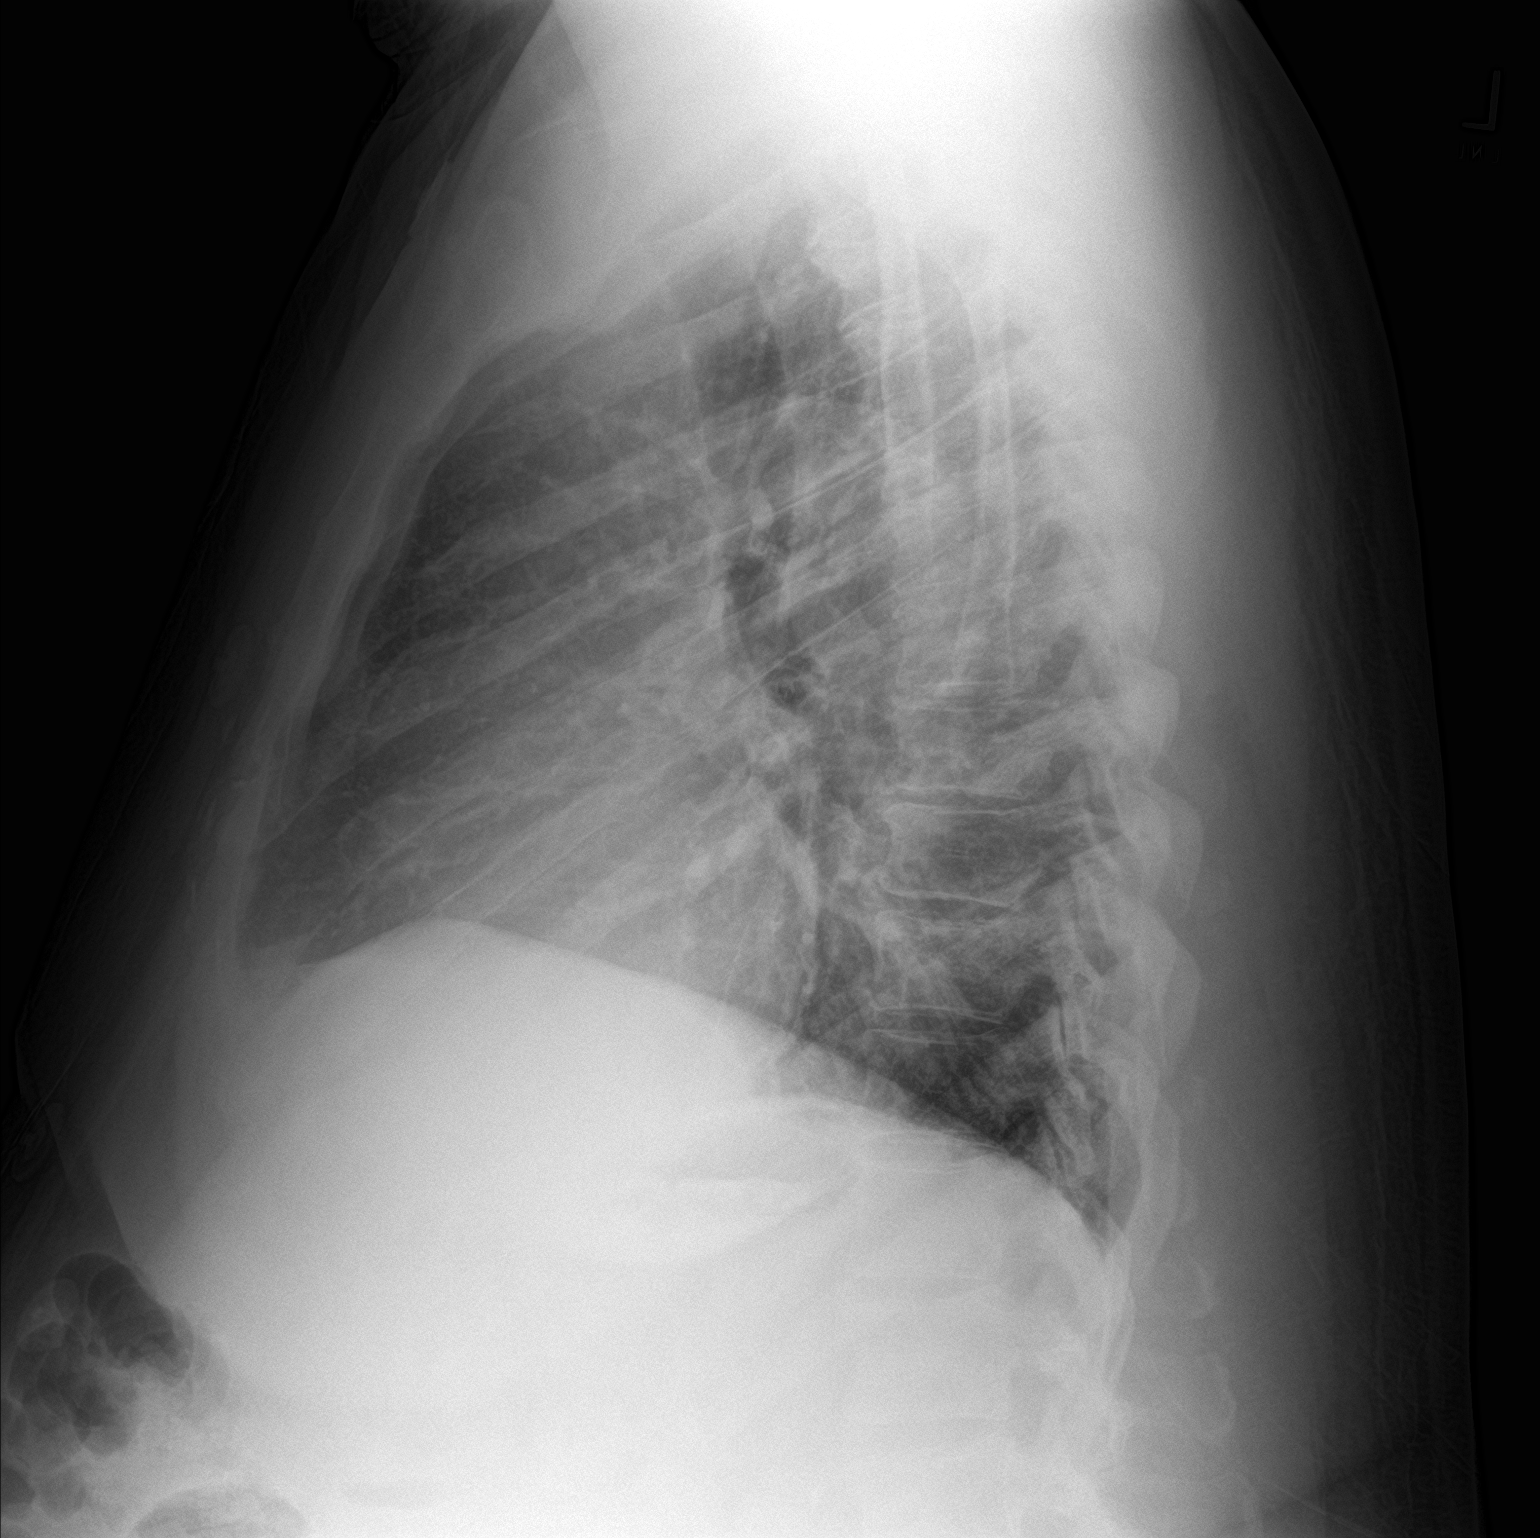

[2 of 2 positions shown; findings below may reference images not displayed]

FINDINGS: Heart is upper limits normal in size. No confluent opacities or
effusions. No acute bony abnormality.
IMPRESSION: No active cardiopulmonary disease.

## 2019-12-10 ENCOUNTER — Telehealth: Payer: Self-pay | Admitting: *Deleted

## 2019-12-10 NOTE — Telephone Encounter (Signed)
Unable to leave  [employer].

## 2019-12-16 ENCOUNTER — Ambulatory Visit: Payer: Managed Care, Other (non HMO) | Admitting: Cardiovascular Disease

## 2019-12-18 ENCOUNTER — Ambulatory Visit: Payer: Managed Care, Other (non HMO) | Admitting: General Practice

## 2019-12-18 NOTE — Progress Notes (Deleted)
Cardiology Clinic Note   Patient Name: Miguel Ho Date of Encounter: 12/18/2019  Primary Care Provider:  Ralene Ok, MD Primary Cardiologist:  Chilton Si, MD  Patient Profile    Miguel Ho 59 year old male presents today for follow-up of his essential hypertension, CHF, hypertension,  ICM, and HLD.  Past Medical History    Past Medical History:  Diagnosis Date  . Childhood asthma   . Chronic combined systolic and diastolic heart failure (HCC) 10/12/2015  . Diabetic peripheral neuropathy (HCC)   . Essential hypertension 10/12/2015  . GERD (gastroesophageal reflux disease)   . Gout    "on daily RX" (11/09/2017)  . Hyperlipidemia 10/12/2015  . Morbid obesity (HCC) 10/12/2015  . OSA on CPAP   . Type II diabetes mellitus (HCC)    Past Surgical History:  Procedure Laterality Date  . CARDIAC CATHETERIZATION  2003   nl cors, Dr Elsie Lincoln  . RIGHT/LEFT HEART CATH AND CORONARY ANGIOGRAPHY N/A 11/13/2017   Procedure: RIGHT/LEFT HEART CATH AND CORONARY ANGIOGRAPHY;  Surgeon: [name], MD;  Location: [facility];  Service: Cardiovascular;  Laterality: N/A;  . TONSILLECTOMY      Allergies  No Known Allergies  History of Present Illness    [name] has past medical history of has a past medical history of chronic systolic and diastolic heart failure (resolved), HTN, HLD, OSA, and diabetes.  He was first seen in 2007 for evaluation and management of his chronic systolic heart failure.  He reported that he was diagnosed with HF in early 2000's.  He reported undergoing cardiac catheterization which did not reveal significant plaque.  He then followed up with cardiology for short period of time before being lost to follow-up for several years.  His echocardiogram 12-20 16 showed an LVEF of 40-45% and hypokinesis of the mid-apical anterior lateral myocardium, and grade 1 diastolic dysfunction.  He indicated that he gets upper extremity edema after eating  certain foods.  He was compliant with his CPAP on and off.  [name] was admitted to the hospital 10/2017 with acute on chronic heart failure.  His echocardiogram during that time showed an LVEF of 15 to 20%.  He was diuresed and started on Entresto.  A left heart catheterization revealed no coronary artery disease.  A repeat echo was completed 03/12/2018 showed an improvement in his LVEF to 30%.  Along with Entresto carvedilol and hydralazine were added to his medication regimen.  His echocardiogram 05/2018 showed an improvement in his EF to 50-55%.  He was last seen by Dr. Duke Salvia on 11/23/2018 during that time he was doing well.  He stated that he sometimes had numbness in his feet.  He denied chest pain and shortness of breath.  He was following a strict diet and cooking most of his meals at home.  He was rarely having lower extremity edema.  He denied orthopnea and PND.  He had not been exercising much however, he stated that he did have a stationary bike which she tried to use occasionally.  He also indicated that when he went out he tried to park further away from stores to increase his walking.  He presents the clinic today and states***  *** denies chest pain, shortness of breath, lower extremity edema, fatigue, palpitations, melena, hematuria, hemoptysis, diaphoresis, weakness, presyncope, syncope, orthopnea, and PND.   Home Medications    Prior to Admission medications   Medication Sig Start Date End Date Taking? Authorizing Provider  allopurinol (ZYLOPRIM) 300  MG tablet Take 300 mg by mouth daily.      [provider]  aspirin EC 81 MG tablet Take 81 mg by mouth daily.      [provider]  carvedilol (COREG) 25 MG tablet TAKE 1 TABLET BY MOUTH TWICE A DAY 02/18/19   Chilton Si, MD  ENTRESTO 24-26 MG TAKE 1 TABLET BY MOUTH TWICE A DAY 05/21/18   Chilton Si, MD  furosemide (LASIX) 40 MG tablet Take 40 mg by mouth daily.      [provider]    gabapentin (NEURONTIN) 300 MG capsule Take 300 mg by mouth at bedtime. 09/14/17   [provider]  hydrALAZINE (APRESOLINE) 50 MG tablet TAKE 1 TABLET BY MOUTH THREE TIMES A DAY 02/11/19   Chilton Si, MD  LEVEMIR FLEXTOUCH 100 UNIT/ML Pen Inject 70 Units into the skin daily.  03/28/16   [provider]  lovastatin (MEVACOR) 40 MG tablet Take 40 mg by mouth at bedtime.      [provider]  metFORMIN (GLUCOPHAGE) 500 MG tablet Take 500 mg by mouth 2 (two) times daily with a meal.    [provider]  OZEMPIC 1 MG/DOSE SOPN Inject 1 mg as directed once a week. 02/24/18   [provider]  potassium chloride SA (K-DUR,KLOR-CON) 20 MEQ tablet Take 20 mEq by mouth daily.     [provider]    Family History    Family History  Problem Relation Age of Onset  . Heart attack Father        deceased age 17  . Hypertension Father   . Diabetes Father   . Heart disease Father   . Cancer Sister        leukemia  . Heart attack Mother        deceased age 69  . Heart attack Paternal Grandmother    He indicated that his mother is deceased. He indicated that his father is deceased. He indicated that his sister is alive. He indicated that his maternal grandmother is deceased. He indicated that his maternal grandfather is deceased. He indicated that his paternal grandmother is deceased. He indicated that his paternal grandfather is deceased.  Social History    Social History   Socioeconomic History  . Marital status: Married    Spouse name: Not on file  . Number of children: Not on file  . Years of education: Not on file  . Highest education level: Not on file  Occupational History  . Occupation: Music therapist for [employer]  . Smoking status: Never Smoker  . Smokeless tobacco: Never Used  Substance and Sexual Activity  . Alcohol use: Yes    Comment: 11/09/2017 "on major holidays I might have a drink; last drink  was 02/2017"  . Drug use: No  . Sexual activity: Not on file  Other Topics Concern  . Not on file  Social History Narrative   Epworth scale per 10/12/15 is a 3   Social Determinants of Health   Financial Resource Strain:   . Difficulty of Paying Living Expenses: Not on file  Food Insecurity:   . Worried About Programme researcher, broadcasting/film/video in the Last Year: Not on file  . Ran Out of Food in the Last Year: Not on file  Transportation Needs:   . Lack of Transportation (Medical): Not on file  . Lack of Transportation (Non-Medical): Not on file  Physical Activity:   . Days of Exercise  per Week: Not on file  . Minutes of Exercise per Session: Not on file  Stress:   . Feeling of Stress : Not on file  Social Connections:   . Frequency of Communication with Friends and Family: Not on file  . Frequency of Social Gatherings with Friends and Family: Not on file  . Attends Religious Services: Not on file  . Active Member of Clubs or Organizations: Not on file  . Attends Archivist Meetings: Not on file  . Marital Status: Not on file  Intimate Partner Violence:   . Fear of Current or Ex-Partner: Not on file  . Emotionally Abused: Not on file  . Physically Abused: Not on file  . Sexually Abused: Not on file     Review of Systems    General:  No chills, fever, night sweats or weight changes.  Cardiovascular:  No chest pain, dyspnea on exertion, edema, orthopnea, palpitations, paroxysmal nocturnal dyspnea. Dermatological: No rash, lesions/masses Respiratory: No cough, dyspnea Urologic: No hematuria, dysuria Abdominal:   No nausea, vomiting, diarrhea, bright red blood per rectum, melena, or hematemesis Neurologic:  No visual changes, wkns, changes in mental status. All other systems reviewed and are otherwise negative except as noted above.  Physical Exam    VS:  There were no vitals taken for this visit. , BMI There is no height or weight on file to calculate BMI. GEN: Well  nourished, well developed, in no acute distress. HEENT: normal. Neck: Supple, no JVD, carotid bruits, or masses. Cardiac: RRR, no murmurs, rubs, or gallops. No clubbing, cyanosis, edema.  Radials/DP/PT 2+ and equal bilaterally.  Respiratory:  Respirations regular and unlabored, clear to auscultation bilaterally. GI: Soft, nontender, nondistended, BS + x 4. MS: no deformity or atrophy. Skin: warm and dry, no rash. Neuro:  Strength and sensation are intact. Psych: Normal affect.  Accessory Clinical Findings    ECG personally reviewed by me today- *** - No acute changes  EKG 06/21/2018 Normal sinus rhythm 99 bpm  Echocardiogram 06/11/2018 Study Conclusions   - Left ventricle: The cavity size was normal. Wall thickness was  increased in a pattern of mild LVH. Systolic function was normal.  The estimated ejection fraction was in the range of 50% to 55%.  Wall motion was normal; there were no regional wall motion  abnormalities. Doppler parameters are consistent with abnormal  left ventricular relaxation (grade 1 diastolic dysfunction).   Cardiac catheterization 11/13/2017 Conclusions: 1. Large, tortuous coronary arteries without angiographically significant coronary artery disease. 2. Normal left and right heart filling pressures. 3. Normal pulmonary artery pressure. 4. Low normal Fick cardiac index.  Recommendations: 1. Medical therapy for non-ischemic cardiomyopathy.    Assessment & Plan   1.  Chronic systolic heart failure-LVEF reduced to 28 October 2017 and has recovered to 50-55% with 05/2018 echo.  Weight today***.  Euvolemic today. Continue carvedilol 25 mg twice daily Continue Entresto 24-26 mg twice daily Continue hydralazine 50 mg 3 times daily Continue furosemide 40 mg daily Heart healthy low-sodium diet-salty 6 given Increase physical activity as tolerated Repeat BMP  Essential hypertension-BP today*** Continue carvedilol 25 mg twice daily Continue  Entresto 24-26 mg twice daily Continue hydralazine 50 mg 3 times daily Heart healthy low-sodium diet-salty 6 given Increase physical activity as tolerated  Hyperlipidemia-LDL 61 04/10/2018 Continue lovastatin 40 mg daily Heart healthy low-sodium high-fiber diet Increase physical activity as tolerated Repeat lipid panel  Morbid obesity-weight today***down from***pounds Heart healthy low-sodium diet Increase physical activity as tolerated-patient exercise  a ***minutes. Continue weight loss  Obstructive sleep apnea-states he is more compliant with his CPAP Continue CPAP use Continue weight loss  Disposition: Follow-up with Dr. Duke Salvia in 1 year.   Thomasene Ripple. Zayanna Pundt NP-C       [facility] [address] Office [phone] Fax [phone]

## 2019-12-20 NOTE — Progress Notes (Signed)
Cardiology Clinic Note   Patient Name: Miguel Ho Date of Encounter: 12/24/2019  Primary Care Provider:  Ralene Ok, MD Primary Cardiologist:  Chilton Si, MD  Patient Profile    Miguel Ho 59 year old male presents today for follow-up of his essential hypertension, CHF, hypertension,  ICM, and HLD.  Past Medical History    Past Medical History:  Diagnosis Date  . Childhood asthma   . Chronic combined systolic and diastolic heart failure (HCC) 10/12/2015  . Diabetic peripheral neuropathy (HCC)   . Essential hypertension 10/12/2015  . GERD (gastroesophageal reflux disease)   . Gout    "on daily RX" (11/09/2017)  . Hyperlipidemia 10/12/2015  . Morbid obesity (HCC) 10/12/2015  . OSA on CPAP   . Type II diabetes mellitus (HCC)    Past Surgical History:  Procedure Laterality Date  . CARDIAC CATHETERIZATION  2003   nl cors, Dr Elsie Lincoln  . RIGHT/LEFT HEART CATH AND CORONARY ANGIOGRAPHY N/A 11/13/2017   Procedure: RIGHT/LEFT HEART CATH AND CORONARY ANGIOGRAPHY;  Surgeon: [name], MD;  Location: [facility];  Service: Cardiovascular;  Laterality: N/A;  . TONSILLECTOMY      Allergies  No Known Allergies  History of Present Illness    [name] has past medical history of has a past medical history of chronic systolic and diastolic heart failure (resolved), HTN, HLD, OSA, and diabetes.  He was first seen in 2007 for evaluation and management of his chronic systolic heart failure.  He reported that he was diagnosed with HF in early 2000's.  He reported undergoing cardiac catheterization which did not reveal significant plaque.  He then followed up with cardiology for short period of time before being lost to follow-up for several years.  His echocardiogram 12-20 16 showed an LVEF of 40-45% and hypokinesis of the mid-apical anterior lateral myocardium, and grade 1 diastolic dysfunction.  He indicated that he gets upper extremity edema after eating  certain foods.  He was compliant with his CPAP on and off.   [name] was admitted to the hospital 10/2017 with acute on chronic heart failure.  His echocardiogram during that time showed an LVEF of 15 to 20%.  He was diuresed and started on Entresto.  A left heart catheterization revealed no coronary artery disease.  A repeat echo was completed 03/12/2018 showed an improvement in his LVEF to 30%.  Along with Entresto carvedilol and hydralazine were added to his medication regimen.  His echocardiogram 05/2018 showed an improvement in his EF to 50-55%.   He was last seen by Dr. Duke Salvia on 11/23/2018 during that time he was doing well.  He stated that he sometimes had numbness in his feet.  He denied chest pain and shortness of breath.  He was following a strict diet and cooking most of his meals at home.  He was rarely having lower extremity edema.  He denied orthopnea and PND.  He had not been exercising much however, he stated that he did have a stationary bike which she tried to use occasionally.  He also indicated that when he went out he tried to park further away from stores to increase his walking.   He presents the clinic today and states he has been doing well.  He continues to use his stationary bike for 30 minutes every other day with a goal of using it every day.  He has been very strict with his diet following a heart healthy low-sodium carb modified diet.  He  has not had any issues with his medications.  He states he is ready to get the COVID-19 vaccine.  He has no concerns about the vaccine or its administration.  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   He denies chest pain, shortness of breath, lower extremity edema, fatigue, palpitations, melena, hematuria, hemoptysis, diaphoresis, weakness, presyncope, syncope, orthopnea, and PND.  Home Medications    Prior to Admission medications   Medication Sig Start Date End  Date Taking? Authorizing Provider  allopurinol (ZYLOPRIM) 300 MG tablet Take 300 mg by mouth daily.      [provider]  aspirin EC 81 MG tablet Take 81 mg by mouth daily.      [provider]  carvedilol (COREG) 25 MG tablet TAKE 1 TABLET BY MOUTH TWICE A DAY 02/18/19   Skeet Latch, MD  ENTRESTO 24-26 MG TAKE 1 TABLET BY MOUTH TWICE A DAY 05/21/18   Skeet Latch, MD  furosemide (LASIX) 40 MG tablet Take 40 mg by mouth daily.      [provider]  gabapentin (NEURONTIN) 300 MG capsule Take 300 mg by mouth at bedtime. 09/14/17   [provider]  hydrALAZINE (APRESOLINE) 50 MG tablet TAKE 1 TABLET BY MOUTH THREE TIMES A DAY 02/11/19   Skeet Latch, MD  LEVEMIR FLEXTOUCH 100 UNIT/ML Pen Inject 70 Units into the skin daily.  03/28/16   [provider]  lovastatin (MEVACOR) 40 MG tablet Take 40 mg by mouth at bedtime.      [provider]  metFORMIN (GLUCOPHAGE) 500 MG tablet Take 500 mg by mouth 2 (two) times daily with a meal.    [provider]  OZEMPIC 1 MG/DOSE SOPN Inject 1 mg as directed once a week. 02/24/18   [provider]  potassium chloride SA (K-DUR,KLOR-CON) 20 MEQ tablet Take 20 mEq by mouth daily.     [provider]    Family History    Family History  Problem Relation Age of Onset  . Heart attack Father        deceased age 15  . Hypertension Father   . Diabetes Father   . Heart disease Father   . Cancer Sister        leukemia  . Heart attack Mother        deceased age 40  . Heart attack Paternal Grandmother    He indicated that his mother is deceased. He indicated that his father is deceased. He indicated that his sister is alive. He indicated that his maternal grandmother is deceased. He indicated that his maternal grandfather is deceased. He indicated that his paternal grandmother is deceased. He indicated that his paternal grandfather is deceased.  Social History    Social  History   Socioeconomic History  . Marital status: Married    Spouse name: Not on file  . Number of children: Not on file  . Years of education: Not on file  . Highest education level: Not on file  Occupational History  . Occupation: Gaffer for [employer]  . Smoking status: Never Smoker  . Smokeless tobacco: Never Used  Substance and Sexual Activity  . Alcohol use: Yes    Comment: 11/09/2017 "on major holidays I might have a drink; last drink was 02/2017"  . Drug use: No  . Sexual activity: Not on file  Other Topics Concern  . Not on file  Social History Narrative   Epworth scale per 10/12/15 is a 3   Social Determinants of Health   Financial Resource Strain:   . Difficulty of Paying Living Expenses: Not on file  Food Insecurity:   . Worried About Programme researcher, broadcasting/film/video in the Last Year: Not on file  . Ran Out of Food in the Last Year: Not on file  Transportation Needs:   . Lack of Transportation (Medical): Not on file  . Lack of Transportation (Non-Medical): Not on file  Physical Activity:   . Days of Exercise per Week: Not on file  . Minutes of Exercise per Session: Not on file  Stress:   . Feeling of Stress : Not on file  Social Connections:   . Frequency of Communication with Friends and Family: Not on file  . Frequency of Social Gatherings with Friends and Family: Not on file  . Attends Religious Services: Not on file  . Active Member of Clubs or Organizations: Not on file  . Attends Banker Meetings: Not on file  . Marital Status: Not on file  Intimate Partner Violence:   . Fear of Current or Ex-Partner: Not on file  . Emotionally Abused: Not on file  . Physically Abused: Not on file  . Sexually Abused: Not on file     Review of Systems    General:  No chills, fever, night sweats or weight changes.  Cardiovascular:  No chest pain, dyspnea on exertion, edema, orthopnea, palpitations, paroxysmal nocturnal  dyspnea. Dermatological: No rash, lesions/masses Respiratory: No cough, dyspnea Urologic: No hematuria, dysuria Abdominal:   No nausea, vomiting, diarrhea, bright red blood per rectum, melena, or hematemesis Neurologic:  No visual changes, wkns, changes in mental status. All other systems reviewed and are otherwise negative except as noted above.  Physical Exam    VS:  BP 130/76   Pulse (!) 101   Temp 97.7 F (36.5 C)   Ht 5' 9.5" (1.765 m)   Wt 276 lb 9.6 oz (125.5 kg)   SpO2 96%   BMI 40.26 kg/m  , BMI Body mass index is 40.26 kg/m. GEN: Well nourished, well developed, in no acute distress. HEENT: normal. Neck: Supple, no JVD, carotid bruits, or masses. Cardiac: RRR, no murmurs, rubs, or gallops. No clubbing, cyanosis, edema.  Radials/DP/PT 2+ and equal bilaterally.  Respiratory:  Respirations regular and unlabored, clear to auscultation bilaterally. GI: Soft, nontender, nondistended, BS + x 4. MS: no deformity or atrophy. Skin: warm and dry, no rash. Neuro:  Strength and sensation are intact. Psych: Normal affect.  Accessory Clinical Findings    ECG personally reviewed by me today-sinus tachycardia septal infarct undetermined age 45 bpm- No acute changes  EKG 06/21/2018 Normal sinus rhythm 99 bpm   Echocardiogram 06/11/2018 Study Conclusions   - Left ventricle: The cavity size was normal. Wall thickness was    increased in a pattern of mild LVH. Systolic function was normal.    The estimated ejection fraction was in the range of 50% to 55%.    Wall motion was normal; there were no regional wall motion    abnormalities. Doppler parameters are consistent with abnormal    left ventricular relaxation (grade 1 diastolic dysfunction).    Cardiac catheterization 11/13/2017 Conclusions: 1. Large, tortuous coronary arteries without angiographically significant coronary artery disease. 2. Normal left and right heart filling pressures. 3. Normal pulmonary artery  pressure. 4. Low normal Fick cardiac index.   Recommendations: 1. Medical therapy for non-ischemic  cardiomyopathy.   Assessment & Plan   1.  Chronic systolic heart failure-LVEF reduced to 28 October 2017 and has recovered to 50-55% with 05/2018 echo.  Weight today 276 pounds.  Euvolemic today. Continue carvedilol 25 mg twice daily Continue Entresto 24-26 mg twice daily Continue hydralazine 50 mg 3 times daily Continue furosemide 40 mg daily Heart healthy low-sodium diet-salty 6 given Increase physical activity as tolerated Repeat BMP   Essential hypertension-BP today 130/76.  Well-controlled at home Continue carvedilol 25 mg twice daily Continue Entresto 24-26 mg twice daily Continue hydralazine 50 mg 3 times daily Heart healthy low-sodium diet-salty 6 given Increase physical activity as tolerated   Hyperlipidemia-LDL 61 04/10/2018 Continue lovastatin 40 mg daily Heart healthy low-sodium high-fiber diet Increase physical activity as tolerated Order direct LDL   Morbid obesity-weight today 276 up from 271 pounds Heart healthy low-sodium diet Increase physical activity as toleratedContinue weight loss   Obstructive sleep apnea-states he is  compliant with his CPAP Continue CPAP use Continue weight loss   Disposition: Follow-up with Dr. Duke Salvia in 1 year.  Thomasene Ripple. Jarrah Babich NP-C       [facility] [address] Office [phone] Fax [phone]

## 2019-12-24 ENCOUNTER — Encounter: Payer: Self-pay | Admitting: General Practice

## 2019-12-24 ENCOUNTER — Other Ambulatory Visit: Payer: Self-pay

## 2019-12-24 ENCOUNTER — Ambulatory Visit: Payer: Managed Care, Other (non HMO) | Admitting: General Practice

## 2019-12-24 VITALS — BP 130/76 | HR 101 | Temp 97.7°F | Ht 69.5 in | Wt 276.6 lb

## 2019-12-24 DIAGNOSIS — Z79899 Other long term (current) drug therapy: Secondary | ICD-10-CM | POA: Diagnosis not present

## 2019-12-24 DIAGNOSIS — I10 Essential (primary) hypertension: Secondary | ICD-10-CM | POA: Diagnosis not present

## 2019-12-24 DIAGNOSIS — E785 Hyperlipidemia, unspecified: Secondary | ICD-10-CM

## 2019-12-24 DIAGNOSIS — I5022 Chronic systolic (congestive) heart failure: Secondary | ICD-10-CM

## 2019-12-24 DIAGNOSIS — E1169 Type 2 diabetes mellitus with other specified complication: Secondary | ICD-10-CM | POA: Diagnosis not present

## 2019-12-24 LAB — BASIC METABOLIC PANEL
BUN/Creatinine Ratio: 20 (ref 9–20)
BUN: 35 mg/dL — ABNORMAL HIGH (ref 6–24)
CO2: 22 mmol/L (ref 20–29)
Calcium: 10.7 mg/dL — ABNORMAL HIGH (ref 8.7–10.2)
Chloride: 99 mmol/L (ref 96–106)
Creatinine, Ser: 1.77 mg/dL — ABNORMAL HIGH (ref 0.76–1.27)
GFR calc Af Amer: 48 mL/min/{1.73_m2} — ABNORMAL LOW (ref >59)
GFR calc non Af Amer: 41 mL/min/{1.73_m2} — ABNORMAL LOW (ref >59)
Glucose: 100 mg/dL — ABNORMAL HIGH (ref 65–99)
Potassium: 4.6 mmol/L (ref 3.5–5.2)
Sodium: 136 mmol/L (ref 134–144)

## 2019-12-24 LAB — LDL CHOLESTEROL, DIRECT: LDL Direct: 70 mg/dL (ref 0–99)

## 2019-12-24 NOTE — Patient Instructions (Signed)
Labwork: DIR LDL AND BMET HERE IN OUR OFFICE AT [facility]   If you have labs (blood work) drawn today and your tests are completely normal, you will receive your results only by: Marland Kitchen MyChart Message (if you have MyChart) OR A paper copy in the mail If you have any lab test that is abnormal or we need to change your treatment, we will call you to review these results. You may go to any [facility] lab that is convenient for you however, we do have a lab in our office that is able to assist you. You do NOT need an appointment for our lab. Once in our office in our office lobby there is a podium where you sign-in and ring the doorbell to alert [place] that you are here. Lab is open 8:00am and closes at 4:00pm; closes for lunch from 12:45 - 1:45pm. PLEASE BRING A COPY OF YOUR INSURANCE CARD WITH YOU.  Special Instructions: PLEASE READ AND FOLLOW SALTY 6 ATTACHED  Reduce your risk of getting COVID-19 With your heart disease it is especially important for people at increased risk of severe illness from COVID-19, and those who live with them, to protect themselves from getting COVID-19. The best way to protect yourself and to help reduce the spread of the virus that causes COVID-19 is to: Marland Kitchen Limit your interactions with other people as much as possible. . Take precautions to prevent getting COVID-19 when you do interact with others. If you start feeling sick and think you may have COVID-19, get in touch with your healthcare provider within 24 hours.  Follow-Up: 12 months Please call our office 2 months in advance, DEC 2021 to schedule this FEB 2022 appointment. In Person You may see Chilton Si, MD or one of the following Advanced Practice Providers on your designated Care Team:  Edd Fabian, FNP  Corine Shelter, PA-C  [place], [place].    At [facility], you and your health needs are our priority.  As part of our continuing mission to provide you with exceptional heart care, we have created designated  Provider Care Teams.  These Care Teams include your primary Cardiologist (physician) and Advanced Practice Providers (APPs -  Physician Assistants and Nurse Practitioners) who all work together to provide you with the care you need, when you need it.  Thank you for choosing [facility] at [facility]!!

## 2020-01-13 ENCOUNTER — Ambulatory Visit: Payer: Managed Care, Other (non HMO) | Attending: Internal Medicine

## 2020-01-13 ENCOUNTER — Other Ambulatory Visit: Payer: Self-pay

## 2020-01-13 DIAGNOSIS — Z23 Encounter for immunization: Secondary | ICD-10-CM | POA: Insufficient documentation

## 2020-01-13 NOTE — Progress Notes (Signed)
   Covid-19 Vaccination Clinic  Name:  Miguel Ho    MRN: [id number] DOB: [birth year]  01/13/2020  Miguel Ho was observed post Covid-19 immunization for 15 minutes without incidence. He was provided with Vaccine Information Sheet and instruction to access the V-Safe system.   Miguel Ho was instructed to call 911 with any severe reactions post vaccine: Marland Kitchen Difficulty breathing  . Swelling of your face and throat  . A fast heartbeat  . A bad rash all over your body  . Dizziness and weakness    Immunizations Administered    Name Date Dose VIS Date Route   Pfizer COVID-19 Vaccine 01/13/2020 10:25 AM 0.3 mL 10/25/2019 Intramuscular   Manufacturer: [employer], [employer]   Lot: [id number]   NDC: [address]-5

## 2020-02-05 ENCOUNTER — Ambulatory Visit: Payer: Self-pay | Attending: Internal Medicine

## 2020-02-05 DIAGNOSIS — Z23 Encounter for immunization: Secondary | ICD-10-CM

## 2020-02-05 NOTE — Progress Notes (Signed)
   Covid-19 Vaccination Clinic  Name:  Miguel Ho    MRN: [id number] DOB: Nov 02, 1961  02/05/2020  [name] was observed post Covid-19 immunization for 15 minutes without incident. He was provided with Vaccine Information Sheet and instruction to access the V-Safe system.   [name] was instructed to call 911 with any severe reactions post vaccine: Marland Kitchen Difficulty breathing  . Swelling of face and throat  . A fast heartbeat  . A bad rash all over body  . Dizziness and weakness   Immunizations Administered    Name Date Dose VIS Date Route   Pfizer COVID-19 Vaccine 02/05/2020  9:59 AM 0.3 mL 10/25/2019 Intramuscular   Manufacturer: [employer], [employer]   Lot: [id number]   NDC: [address]-6

## 2020-03-13 ENCOUNTER — Other Ambulatory Visit: Payer: Self-pay | Admitting: Cardiovascular Disease

## 2020-03-13 NOTE — Telephone Encounter (Signed)
*  STAT* If patient is at the pharmacy, call can be transferred to refill team.   1. Which medications need to be refilled? (please list name of each medication and dose if known)  ENTRESTO 24-26 MG  2. Which pharmacy/location (including street and city if local pharmacy) is medication to be sent to?  [facility] - [place], [place] - [address]  3. Do they need a 30 day or 90 day supply? 90  Pt is out of medication

## 2020-03-15 NOTE — Telephone Encounter (Signed)
OK to refill once.  Needs to have follow up.

## 2020-03-16 ENCOUNTER — Telehealth: Payer: Self-pay | Admitting: Cardiovascular Disease

## 2020-03-16 NOTE — Telephone Encounter (Signed)
Patient states his insurance recently change and he needs a prior Berkley Harvey so his insurance will cover his Sherryll Burger.

## 2020-03-16 NOTE — Telephone Encounter (Signed)
Routed to primary nurse concerning PA request

## 2020-03-17 MED ORDER — ENTRESTO 24-26 MG PO TABS: 1.0000 | ORAL_TABLET | Freq: Two times a day (BID) | ORAL | 1 refills | Status: AC

## 2020-03-19 ENCOUNTER — Other Ambulatory Visit: Payer: Self-pay | Admitting: Cardiovascular Disease

## 2020-03-19 NOTE — Telephone Encounter (Signed)
*  STAT* If patient is at the pharmacy, call can be transferred to refill team.   1. Which medications need to be refilled? (please list name of each medication and dose if known) sacubitril-valsartan (ENTRESTO) 24-26 MG  2. Which pharmacy/location (including street and city if local pharmacy) is medication to be sent to? [facility] - [place], [place] - [address]  3. Do they need a 30 day or 90 day supply? 90   Patient is out of medication.

## 2020-03-20 NOTE — Telephone Encounter (Signed)
Tried to start PA and received notification there is currently an appeal for this

## 2020-04-01 NOTE — Telephone Encounter (Signed)
Pt called concerning his medication Entresto. Pt stated that he has Nurse, learning disability and I told the pt that I could leave a $10 co-pay card at our [facility] office for pt to pick up, because per Regis Bill, LPN, that a PA was attempted, but there is an appeal for this medication. Pt stated that he would pick up the co-pay card and take it to his pharmacy. I advised pt if there is any other problems, questions or concerns, to give our office a call back. Pt verbalized understanding.

## 2020-04-01 NOTE — Telephone Encounter (Signed)
Received another notification from [facility] regarding PA Per covermymeds no PA needed [facility] and [facility] 5/14 Patient medication is covered but with a $200 copay He will pickup copay card and call back if any further issues

## 2020-05-02 ENCOUNTER — Other Ambulatory Visit: Payer: Self-pay | Admitting: Cardiovascular Disease

## 2020-08-11 ENCOUNTER — Telehealth: Payer: Self-pay | Admitting: Cardiovascular Disease

## 2020-08-11 NOTE — Telephone Encounter (Signed)
Pt hs been scheduled to see Dr. Duke Salvia 08/13/2020.  Surgery clearance will be addressed at that time. Will route back to the requesting surgeon's office to make them aware.

## 2020-08-11 NOTE — Telephone Encounter (Signed)
   Primary Cardiologist: Chilton Si, MD  Chart reviewed as part of pre-operative protocol coverage. Because of Miguel Ho's past medical history and time since last visit, they will require a follow-up visit in order to better assess preoperative cardiovascular risk.  Pre-op covering staff: - Please schedule appointment and call patient to inform them. If patient already had an upcoming appointment within acceptable timeframe, please add "pre-op clearance" to the appointment notes so provider is aware. - Please contact requesting surgeon's office via preferred method (i.e, phone, fax) to inform them of need for appointment prior to surgery.  If applicable, this message will also be routed to pharmacy pool and/or primary cardiologist for input on holding anticoagulant/antiplatelet agent as requested below so that this information is available to the clearing provider at time of patient's appointment.   Laverda Page, NP  08/11/2020, 3:07 PM

## 2020-08-11 NOTE — Telephone Encounter (Signed)
   [facility] Pre-operative Risk Assessment    HEARTCARE STAFF: - Please ensure there is not already an duplicate clearance open for this procedure. - Under Visit Info/Reason for Call, type in Other and utilize the format Clearance MM/DD/YY or Clearance TBD. Do not use dashes or single digits. - If request is for dental extraction, please clarify the # of teeth to be extracted.  Request for surgical clearance:  1. What type of surgery is being performed? Cystoscopy and ureteroscopy laser lithoprpsy , sten placement    2. When is this surgery scheduled? TBD - pending clearance   3. What type of clearance is required (medical clearance vs. Pharmacy clearance to hold med vs. Both)? Med and Pharmacy   Are there any medications that need to be held prior to surgery and how long?aspirin EC 81 MG tablet [[id number]]  Needs to be held 5 days prior   4. Practice name and name of physician performing surgery?   [facility]   What is the office phone number? [place]  7.   What is the office fax number? [phone]  8.   Anesthesia type (None, local, MAC, general) ? General    Shana A Stovall 08/11/2020, 9:31 AM  _________________________________________________________________   (provider comments below)

## 2020-08-13 ENCOUNTER — Ambulatory Visit (INDEPENDENT_AMBULATORY_CARE_PROVIDER_SITE_OTHER): Payer: Self-pay | Admitting: Cardiovascular Disease

## 2020-08-13 ENCOUNTER — Encounter: Payer: Self-pay | Admitting: *Deleted

## 2020-08-13 ENCOUNTER — Other Ambulatory Visit: Payer: Self-pay

## 2020-08-13 ENCOUNTER — Encounter: Payer: Self-pay | Admitting: Cardiovascular Disease

## 2020-08-13 VITALS — BP 128/80 | HR 93 | Temp 98.1°F | Ht 69.0 in | Wt 273.0 lb

## 2020-08-13 DIAGNOSIS — I10 Essential (primary) hypertension: Secondary | ICD-10-CM

## 2020-08-13 DIAGNOSIS — I5042 Chronic combined systolic (congestive) and diastolic (congestive) heart failure: Secondary | ICD-10-CM

## 2020-08-13 DIAGNOSIS — E7800 Pure hypercholesterolemia, unspecified: Secondary | ICD-10-CM

## 2020-08-13 NOTE — Progress Notes (Signed)
Cardiology Office Note   Date:  08/14/2020   ID:  Miguel Ho, DOB [birth year], MRN [id number]  PCP:  Ralene Ok, MD  Cardiologist:   Chilton Si, MD   No chief complaint on file.   History of Present Illness: Miguel Ho is a 59 y.o. male with chronic systolic and diastolic heart failure (resolved), hypertension, hyperlipidemia, OSA, and diabetes who presents for follow up.  He was initially seen in 2017 for management of chronic systolic heart failure.  Miguel Ho reports that he was diagnosed with heart failure in the early 2000s. He reports undergoing cardiac catheterization that did not reveal any significant blockages.  He followed up with a cardiologist for short period of time but had not been seen in several years.  He had an echo 12/16 that revealed LVEF 40-45% and hypokinesis of the mid-apical anterolateral myocardium.  There was grade 1 diastolic dysfunction.  Overall Miguel Ho has been doing well.  He has noted some LE edema after eating certain foods.  Miguel Ho has a CPAP but uses it off/on.  He was admitted 10/2017 with acute on chronic heart failure.  Echo at that time revealed a reduction in his LVEF to 15 to 20%.  He was diuresed and started on Entresto.  Left heart catheterization revealed no coronary artery disease.  He followed up with Theodore Demark, PA, and was referred for repeat echo 03/12/18 that showed some improvement in his LVEF to 30%.  He was started on Entresto, carvedilol and hydralazine.  Repeat echo 05/2018 showed an improvement in his EF to 50 to 55%.  Since his last appointment Miguel Ho has been doing well.  He has some kidney stones that will require lithotripsy.  He presents today for preprocedural risk assessment.  Overall he has been feeling well.  He has no chest pain and his breathing is stable.  He has some occasional lower extremity edema that is worse when he eats salt or red meat.  He has no orthopnea or PND.  He is not  getting much formal exercise lately which he attributes to working a lot.  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  Occasionally when he overexerts himself he feels exhausted.  He is able to do his yard work without difficulty.  He can go up and down 1 flight of steps and walk more than 4 blocks without having chest pain or shortness of breath.  Past Medical History:  Diagnosis Date  . Childhood asthma   . Chronic combined systolic and diastolic heart failure (HCC) 10/12/2015  . Diabetic peripheral neuropathy (HCC)   . Essential hypertension 10/12/2015  . GERD (gastroesophageal reflux disease)   . Gout    "on daily RX" (11/09/2017)  . Hyperlipidemia 10/12/2015  . Morbid obesity (HCC) 10/12/2015  . OSA on CPAP   . Type II diabetes mellitus (HCC)     Past Surgical History:  Procedure Laterality Date  . CARDIAC CATHETERIZATION  2003   nl cors, Dr Elsie Lincoln  . RIGHT/LEFT HEART CATH AND CORONARY ANGIOGRAPHY N/A 11/13/2017   Procedure: RIGHT/LEFT HEART CATH AND CORONARY ANGIOGRAPHY;  Surgeon: [name], MD;  Location: [facility];  Service: Cardiovascular;  Laterality: N/A;  . TONSILLECTOMY       Current Outpatient Medications  Medication Sig Dispense Refill  . allopurinol (ZYLOPRIM) 300 MG tablet Take 300 mg by mouth daily.      Marland Kitchen aspirin EC 81 MG  tablet Take 81 mg by mouth daily.      . carvedilol (COREG) 25 MG tablet TAKE 1 TABLET BY MOUTH TWICE A DAY 180 tablet 3  . ciprofloxacin (CIPRO) 500 MG tablet Take 500 mg by mouth 2 (two) times daily.    . furosemide (LASIX) 40 MG tablet Take 40 mg by mouth daily.      Marland Kitchen gabapentin (NEURONTIN) 300 MG capsule Take 300 mg by mouth at bedtime.  1  . hydrALAZINE (APRESOLINE) 50 MG tablet TAKE 1 TABLET BY MOUTH THREE TIMES A DAY 90 tablet 5  . LEVEMIR FLEXTOUCH 100 UNIT/ML Pen Inject 70 Units into the skin daily.   4  . lovastatin (MEVACOR) 40 MG tablet Take 40 mg by mouth at bedtime.      .  metFORMIN (GLUCOPHAGE) 500 MG tablet Take 500 mg by mouth 2 (two) times daily with a meal.    . OZEMPIC 1 MG/DOSE SOPN Inject 1 mg as directed once a week.    . potassium chloride SA (K-DUR,KLOR-CON) 20 MEQ tablet Take 20 mEq by mouth daily.     . sacubitril-valsartan (ENTRESTO) 24-26 MG Take 1 tablet by mouth 2 (two) times daily. 60 tablet 1  . tamsulosin (FLOMAX) 0.4 MG CAPS capsule Take 0.4 mg by mouth at bedtime.     No current facility-administered medications for this visit.    Allergies:   Patient has no known allergies.    Social History:  The patient  reports that he has never smoked. He has never used smokeless tobacco. He reports current alcohol use. He reports that he does not use drugs.   Family History:  The patient's family history includes Cancer in his sister; Diabetes in his father; Heart attack in his father, mother, and paternal grandmother; Heart disease in his father; Hypertension in his father.    ROS:  Please see the history of present illness.   Otherwise, review of systems are positive for none.   All other systems are reviewed and negative.    PHYSICAL EXAM: VS:  BP 128/80   Pulse 93   Temp 98.1 F (36.7 C)   Ht 5\' 9"  (1.753 m)   Wt 273 lb (123.8 kg)   SpO2 97%   BMI 40.32 kg/m  , BMI Body mass index is 40.32 kg/m. GENERAL:  Well appearing HEENT: Pupils equal round and reactive, fundi not visualized, oral mucosa unremarkable NECK:  No jugular venous distention, waveform within normal limits, carotid upstroke brisk and symmetric, no bruits LUNGS:  Clear to auscultation bilaterally HEART:  RRR.  PMI not displaced or sustained,S1 and S2 within normal limits, no S3, no S4, no clicks, no rubs, no murmurs ABD:  Flat, positive bowel sounds normal in frequency in pitch, no bruits, no rebound, no guarding, no midline pulsatile mass, no hepatomegaly, no splenomegaly EXT:  2 plus pulses throughout, no edema, no cyanosis no clubbing SKIN:  No rashes no  nodules NEURO:  Cranial nerves II through XII grossly intact, motor grossly intact throughout PSYCH:  Cognitively intact, oriented to person place and time   EKG:  EKG is ordered today. The ekg ordered 10/12/15 demonstrates Sinus tachycardia rate 105 bpm.  low voltage in the limb and precordial leads. 06/21/2018: Sinus rhythm.  99 bpm. 08/13/2020: Sinus rhythm.  Rate 93 bpm.  Echo 06/11/18: Study Conclusions  - Left ventricle: The cavity size was normal. Wall thickness was   increased in a pattern of mild LVH. Systolic function was normal.  The estimated ejection fraction was in the range of 50% to 55%.   Wall motion was normal; there were no regional wall motion   abnormalities. Doppler parameters are consistent with abnormal   left ventricular relaxation (grade 1 diastolic dysfunction).  Impressions:  - Definity used; low normal to mildly reduced LV systolic function   (EF 50); mild LVH; mild diastolic dysfunction.  Echo 03/12/18: Study Conclusions  - Left ventricle: Apical window is foreshoertened some,   particularly in 2 chamber views. LVEF is approximately 30%with   diffuse hypokinesis. In comparison to images from December 2018   there is no significant change. The cavity size was mildly   dilated. Wall thickness was increased in a pattern of mild LVH.  Echo 10/23/15: Study Conclusions  - Left ventricle: The cavity size was normal. Wall thickness was  increased in a pattern of mild LVH. Systolic function was mildly  to moderately reduced. The estimated ejection fraction was in the  range of 40% to 45%. There is hypokinesis of the  mid-apicalanterolateral myocardium. Doppler parameters are  consistent with abnormal left ventricular relaxation (grade 1  diastolic dysfunction).  LHC/RHC 11/13/17: 1. Large, tortuous coronary arteries without angiographically significant coronary artery disease. 2. Normal left and right heart filling pressures. 3. Normal  pulmonary artery pressure. 4. Low normal Fick cardiac index.  RA 6, RV 32/9, PA 26/15, mean PA 19, PCWP 12 .   Recent Labs: 12/24/2019: BUN 35; Creatinine, Ser 1.77; Potassium 4.6; Sodium 136   05/29/2018: WBC 6.6, hemoglobin 11.8, hematocrit 38.3, platelets 200 Sodium 140, potassium 4.6, BUN 45, creatinine 1.84 AST 19, ALT 19 Hemoglobin A1c 5.0% Total cholesterol 122, triglycerides 201, HDL 33, LDL 49  Lipid Panel    Component Value Date/Time   CHOL 125 04/10/2018 1040   TRIG 139 04/10/2018 1040   HDL 36 (L) 04/10/2018 1040   CHOLHDL 3.5 04/10/2018 1040   CHOLHDL 5.0 12/18/2010 0425   VLDL 70 (H) 12/18/2010 0425   LDLCALC 61 04/10/2018 1040   LDLDIRECT 70 12/24/2019 1043      Wt Readings from Last 3 Encounters:  08/13/20 273 lb (123.8 kg)  12/24/19 276 lb 9.6 oz (125.5 kg)  06/20/19 271 lb (122.9 kg)     ASSESSMENT AND PLAN:  # Pre-operative risk assessment: Miguel Ho is low risk for lithotripsy.  He has no unstable cardiac conditions.  Okay to hold aspirin for 5 days preprocedure.  # Chronic diastolic heart failure: # Hypertension:  Miguel Ho LVEF was reduced to 15% and has now recovered to 50-55%.  He has very mild lower extremity edema.  His JVD is not elevated and he has no orthopnea or PND.  Continue blood pressure management with carvedilol, hydralazine, and Entresto.  Continue Lasix 40 mg daily.  Okay to take extra for lower extremity edema as needed.  # Hyperlipidemia: Continue lovastatin.  Lipids are well have been controlled.  # Morbid obesity: Miguel Ho was encouraged to start back exercising.  We will work for him to the [employer] program to [employer].  # OSA: Continue CPAP.  # DM:  # CKD: Consider adding an SGLT-2 inhibitor.  Current medicines are reviewed at length with the patient today.  The patient does not have concerns regarding medicines.  The following changes have been made:  no change  Labs/ tests ordered today include:   No orders  of the defined types were placed in this encounter.    Disposition:   FU with Kaliel Bolds C. Duke Salvia,  MD in 1 year.     Signed, Chilton Si, MD  08/14/2020 1:14 PM    [facility]

## 2020-08-13 NOTE — Patient Instructions (Addendum)
Medication Instructions:  Your physician recommends that you continue on your current medications as directed. Please refer to the Current Medication list given to you today.'  *If you need a refill on your cardiac medications before your next appointment, please call your pharmacy*  Lab Work: NONE  Testing/Procedures: NONE  Follow-Up: At [facility], you and your health needs are our priority.  As part of our continuing mission to provide you with exceptional heart care, we have created designated Provider Care Teams.  These Care Teams include your primary Cardiologist (physician) and Advanced Practice Providers (APPs -  Physician Assistants and Nurse Practitioners) who all work together to provide you with the care you need, when you need it.  We recommend signing up for the patient portal called "MyChart".  Sign up information is provided on this After Visit Summary.  MyChart is used to connect with patients for Virtual Visits (Telemedicine).  Patients are able to view lab/test results, encounter notes, upcoming appointments, etc.  Non-urgent messages can be sent to your provider as well.   To learn more about what you can do with MyChart, go to [email or web address].    Your next appointment:   12 month(s)  You will receive a reminder letter in the mail two months in advance. If you don't receive a letter, please call our office to schedule the follow-up appointment.  The format for your next appointment:   In Person  Provider:   You may see Chilton Si, MD or one of the following Advanced Practice Providers on your designated Care Team:    Corine Shelter, PA-C  [place], [place]  Edd Fabian, FNP  Other Instructions  SOMEONE FROM THE PREP TEAM WILL BE IN TOUCH WITH YOU   YOU ARE AT LOW RISK FOR YOUR UPCOMING PROCEDURE WITH [facility], OK TO HOLD YOUR ASPIRIN 5 DAYS PRIOR

## 2020-08-14 ENCOUNTER — Encounter: Payer: Self-pay | Admitting: Cardiovascular Disease

## 2020-08-17 NOTE — Addendum Note (Signed)
Addended by: Myna Hidalgo A on: 08/17/2020 03:43 PM   Modules accepted: Orders

## 2020-08-19 ENCOUNTER — Telehealth: Payer: Self-pay

## 2020-08-19 NOTE — Telephone Encounter (Signed)
Call placed to pt reference referral to PREP from Dr Duke Salvia.  Explained program to patient. Is interested. Would need an evening class. Next one starting at end of month at [place]. Agreeable to this.  Given my number. Will call back to schedule intake assessment.

## 2020-08-24 ENCOUNTER — Other Ambulatory Visit: Payer: Self-pay | Admitting: Urology

## 2020-08-28 ENCOUNTER — Telehealth: Payer: Self-pay

## 2020-08-28 NOTE — Telephone Encounter (Signed)
Call to pt reference referral to PREP from Dr Hollis-offered evening class at [facility] starting NOV 2nd T TH 615p-730p  Can do Intake scheduled for OCT 26 at 7pm

## 2020-09-09 NOTE — Progress Notes (Signed)
[facility] PREP Progress Report   Patient Details  Name: Miguel Ho MRN: [id number] Date of Birth: Jun 04, 1961 Age: 59 y.o. PCP: Ralene Ok, MD  Vitals:   09/08/20 1915  BP: 112/78  Pulse: 97  SpO2: 96%  Weight: 274 lb 9.6 oz (124.6 kg)  Height: 5' 9.5" (1.765 m)      [facility] Eval - 09/09/20 0900      Referral    Referring Provider Duke Salvia    Reason for referral Hypertension    Program Start Date 09/15/20   T/TH 615p-730p x 12 wks      Measurement   Neck measurement 17 Inches    Waist Circumference 55 inches    Body fat 32.2 percent      Information for Trainer   Goals More energy, exercise regimen, goal weight 200 lbs    Current Exercise walks w work, intermittent bike ride     Orthopedic Concerns none    Pertinent Medical History OSA-CPAP, DM, past CHF     Current Barriers none    Restrictions/Precautions Diabetic snack before exercise    Medications that affect exercise Beta blocker;Medication causing dizziness/drowsiness      Timed Up and Go (TUGS)   Timed Up and Go Low risk <9 seconds      Mobility and Daily Activities   I find it easy to walk up or down two or more flights of stairs. 3    I have no trouble taking out the trash. 4    I do housework such as vacuuming and dusting on my own without difficulty. 4    I can easily lift a gallon of milk (8lbs). 4    I can easily walk a mile. 4    I have no trouble reaching into high cupboards or reaching down to pick up something from the floor. 4    I do not have trouble doing out-door work such as Loss adjuster, chartered, raking leaves, or gardening. 4      Mobility and Daily Activities   I feel younger than my age. 2    I feel independent. 4    I feel energetic. 2    I live an active life.  4    I feel strong. 3    I feel healthy. 4    I feel active as other people my age. 4      How fit and strong are you.   Fit and Strong Total Score 50          Past Medical History:  Diagnosis Date    Childhood asthma    Chronic combined systolic and diastolic heart failure (HCC) 10/12/2015   Diabetic peripheral neuropathy (HCC)    Essential hypertension 10/12/2015   GERD (gastroesophageal reflux disease)    Gout    "on daily RX" (11/09/2017)   Hyperlipidemia 10/12/2015   Morbid obesity (HCC) 10/12/2015   OSA on CPAP    Type II diabetes mellitus (HCC)    Past Surgical History:  Procedure Laterality Date   CARDIAC CATHETERIZATION  2003   nl cors, Dr Elsie Lincoln   RIGHT/LEFT HEART CATH AND CORONARY ANGIOGRAPHY N/A 11/13/2017   Procedure: RIGHT/LEFT HEART CATH AND CORONARY ANGIOGRAPHY;  Surgeon: [name], MD;  Location: [facility];  Service: Cardiovascular;  Laterality: N/A;   TONSILLECTOMY     Social History   Tobacco Use  Smoking Status Never Smoker  Smokeless Tobacco Never Used      Lawernce Ion  Chariti Havel 09/09/2020, 9:52 AM

## 2020-09-21 NOTE — Progress Notes (Signed)
DUE TO COVID-19 ONLY ONE VISITOR IS ALLOWED TO COME WITH YOU AND STAY IN THE WAITING ROOM ONLY DURING PRE OP AND PROCEDURE DAY OF SURGERY. THE 1 VISITOR  MAY VISIT WITH YOU AFTER SURGERY IN YOUR PRIVATE ROOM DURING VISITING HOURS ONLY!  YOU NEED TO HAVE A COVID 19 TEST ON_11/16/2021 _____ @_______ , THIS TEST MUST BE DONE BEFORE SURGERY,  COVID TESTING SITE [address] [place] , IT IS ON THE RIGHT GOING OUT [address] APPROXIMATELY  2 MINUTES PAST [employer] ON THE RIGHT. ONCE YOUR COVID TEST IS COMPLETED,  PLEASE BEGIN THE QUARANTINE INSTRUCTIONS AS OUTLINED IN YOUR HANDOUT.                Miguel Ho  09/21/2020   Your procedure is scheduled on: 10/02/2020    Report to [facility] Main  Entrance   Report to admitting at   1000 AM     Call this number if you have problems the morning of surgery [phone]    Remember: Do not eat food , candy gum or mints :After Midnight. You may have clear liquids from midnight until 0900am    CLEAR LIQUID DIET   Foods Allowed                                                                       Coffee and tea, regular and decaf                              Plain Jell-O any favor except red or purple                                            Fruit ices (not with fruit pulp)                                      Iced Popsicles                                     Carbonated beverages, regular and diet                                    Cranberry, grape and apple juices Sports drinks like Gatorade Lightly seasoned clear broth or consume(fat free) Sugar, honey syrup   _____________________________________________________________________    BRUSH YOUR TEETH MORNING OF SURGERY AND RINSE YOUR MOUTH OUT, NO CHEWING GUM CANDY OR MINTS.     Take these medicines the morning of surgery with A SIP OF WATER:  Hydralazine, allopurinol, coreg  DO NOT TAKE ANY DIABETIC MEDICATIONS DAY OF YOUR SURGERY                                You may not have any metal on your  body including hair pins and              piercings  Do not wear jewelry, make-up, lotions, powders or perfumes, deodorant             Do not wear nail polish on your fingernails.  Do not shave  48 hours prior to surgery.              Men may shave face and neck.   Do not bring valuables to the hospital. [place].  Contacts, dentures or bridgework may not be worn into surgery.  Leave suitcase in the car. After surgery it may be brought to your room.     Patients discharged the day of surgery will not be allowed to drive home. IF YOU ARE HAVING SURGERY AND GOING HOME THE SAME DAY, YOU MUST HAVE AN ADULT TO DRIVE YOU HOME AND BE WITH YOU FOR 24 HOURS. YOU MAY GO HOME BY TAXI OR UBER OR ORTHERWISE, BUT AN ADULT MUST ACCOMPANY YOU HOME AND STAY WITH YOU FOR 24 HOURS.  Name and phone number of your driver:  Special Instructions: N/A              Please read over the following fact sheets you were given: _____________________________________________________________________  Miguel Ho - Preparing for Surgery Before surgery, you can play an important role.  Because skin is not sterile, your skin needs to be as free of germs as possible.  You can reduce the number of germs on your skin by washing with CHG (chlorahexidine gluconate) soap before surgery.  CHG is an antiseptic cleaner which kills germs and bonds with the skin to continue killing germs even after washing. Please DO NOT use if you have an allergy to CHG or antibacterial soaps.  If your skin becomes reddened/irritated stop using the CHG and inform your nurse when you arrive at Short Stay. Do not shave (including legs and underarms) for at least 48 hours prior to the first CHG shower.  You may shave your face/neck. Please follow these instructions carefully:  1.  Shower with CHG Soap the night before surgery and the  morning of  Surgery.  2.  If you choose to wash your hair, wash your hair first as usual with your  normal  shampoo.  3.  After you shampoo, rinse your hair and body thoroughly to remove the  shampoo.                           4.  Use CHG as you would any other liquid soap.  You can apply chg directly  to the skin and wash                       Gently with a scrungie or clean washcloth.  5.  Apply the CHG Soap to your body ONLY FROM THE NECK DOWN.   Do not use on face/ open                           Wound or open sores. Avoid contact with eyes, ears mouth and genitals (private parts).                       Wash face,  Development worker, international aid (private  parts) with your normal soap.             6.  Wash thoroughly, paying special attention to the area where your surgery  will be performed.  7.  Thoroughly rinse your body with warm water from the neck down.  8.  DO NOT shower/wash with your normal soap after using and rinsing off  the CHG Soap.                9.  Pat yourself dry with a clean towel.            10.  Wear clean pajamas.            11.  Place clean sheets on your bed the night of your first shower and do not  sleep with pets. Day of Surgery : Do not apply any lotions/deodorants the morning of surgery.  Please wear clean clothes to the hospital/surgery center.  FAILURE TO FOLLOW THESE INSTRUCTIONS MAY RESULT IN THE CANCELLATION OF YOUR SURGERY PATIENT SIGNATURE_________________________________  NURSE SIGNATURE__________________________________  ________________________________________________________________________

## 2020-09-22 ENCOUNTER — Encounter (HOSPITAL_COMMUNITY): Payer: Self-pay

## 2020-09-22 ENCOUNTER — Other Ambulatory Visit: Payer: Self-pay

## 2020-09-22 ENCOUNTER — Encounter (HOSPITAL_COMMUNITY)
Admission: RE | Admit: 2020-09-22 | Discharge: 2020-09-22 | Disposition: A | Discharge status: Home / Self Care | Payer: 59 | Source: Ambulatory Visit | Attending: Urology | Admitting: Urology

## 2020-09-22 DIAGNOSIS — Z01818 Encounter for other preprocedural examination: Secondary | ICD-10-CM | POA: Insufficient documentation

## 2020-09-22 HISTORY — DX: Personal history of urinary calculi: Z87.442

## 2020-09-22 HISTORY — DX: Heart failure, unspecified: I50.9

## 2020-09-22 LAB — BASIC METABOLIC PANEL
Anion gap: 6 (ref 5–15)
BUN: 38 mg/dL — ABNORMAL HIGH (ref 6–20)
CO2: 25 mmol/L (ref 22–32)
Calcium: 9.7 mg/dL (ref 8.9–10.3)
Chloride: 108 mmol/L (ref 98–111)
Creatinine, Ser: 1.88 mg/dL — ABNORMAL HIGH (ref 0.61–1.24)
GFR, Estimated: 40 mL/min — ABNORMAL LOW (ref >60)
Glucose, Bld: 101 mg/dL — ABNORMAL HIGH (ref 70–99)
Potassium: 4.6 mmol/L (ref 3.5–5.1)
Sodium: 139 mmol/L (ref 135–145)

## 2020-09-22 LAB — HEMOGLOBIN A1C
Hgb A1c MFr Bld: 5 % (ref 4.8–5.6)
Mean Plasma Glucose: 96.8 mg/dL

## 2020-09-22 LAB — CBC
HCT: 36.7 % — ABNORMAL LOW (ref 39.0–52.0)
Hemoglobin: 11.3 g/dL — ABNORMAL LOW (ref 13.0–17.0)
MCH: 30.8 pg (ref 26.0–34.0)
MCHC: 30.8 g/dL (ref 30.0–36.0)
MCV: 100 fL (ref 80.0–100.0)
Platelets: 222 10*3/uL (ref 150–400)
RBC: 3.67 MIL/uL — ABNORMAL LOW (ref 4.22–5.81)
RDW: 13.3 % (ref 11.5–15.5)
WBC: 5.8 10*3/uL (ref 4.0–10.5)
nRBC: 0 % (ref 0.0–0.2)

## 2020-09-22 LAB — GLUCOSE, CAPILLARY: Glucose-Capillary: 97 mg/dL (ref 70–99)

## 2020-09-22 NOTE — Progress Notes (Addendum)
Anesthesia Review:  PCP: DR Salome Spotted  appt on 09/22/2020 to discuss levemir insulin and cost  Cardiologist : DR Chilton Si 08/13/20- LOV - clearance in note  Chest x-ray : EKG : 08/13/2020  Echo :2019  Stress test: Cardiac Cath :  Activity level:  Can do a flight of stairs without difficulty  Sleep Study/ CPAP : Fasting Blood Sugar :      / Checks Blood Sugar -- times a day:   Checks glucose periodically per pt  Patient has not had LEvemir in approx 3 weeks due to cost of Levemir.  Has appt with DR Ralene Ok on 09/22/2020 to discuss diabetes and meds and costs per pt.   Will call pt on 09/23/2020 to see outcome of visit and obtain note.   Blood Thinner/ Instructions Maurice Small Dose: ASA / Instructions/ Last Dose :  BMP done 09/22/2020 faxed via epic to DR Cardell Peach.  Spoke with pt over the phone on 09/22/2020 after visit with DR Ludwig Clarks.  Patient reports that MD discontinued the Levemir and placed him on Tresiba 70 units in the am starting on 09/23/20 per pt.  Patient was instructed to take regular dose of Tresiba the day before surgery and to take 1/2 his dose of Tresiba the am of surgery.  Patient voiced understanding.   Requested office visit note from 09/22/2020 - Dr Ludwig Clarks .  They are to send.  [phone] number

## 2020-09-23 NOTE — Progress Notes (Signed)
[facility] PREP Weekly Session   Patient Details  Name: Miguel Ho MRN: [id number] Date of Birth: 1961/08/09 Age: 59 y.o. PCP: Ralene Ok, MD  Vitals:   09/22/20 1830  BP: (!) 127/97  Weight: 269 lb (122 kg)     [facility] Weekly seesion - 09/23/20 1000      Weekly Session   Topic Discussed Importance of resistance training;Other ways to be active    Minutes exercised this week 105 minutes    Classes attended to date 3          Fun things since last meeting: walking Grateful for: time with family Nutrition celebration: eating more vegetables     Bonnye Fava 09/23/2020, 10:28 AM

## 2020-09-29 ENCOUNTER — Other Ambulatory Visit (HOSPITAL_COMMUNITY)
Admission: RE | Admit: 2020-09-29 | Discharge: 2020-09-29 | Disposition: A | Discharge status: Home / Self Care | Payer: 59 | Source: Ambulatory Visit | Attending: Urology | Admitting: Urology

## 2020-09-29 DIAGNOSIS — Z01812 Encounter for preprocedural laboratory examination: Secondary | ICD-10-CM | POA: Insufficient documentation

## 2020-09-29 DIAGNOSIS — Z20822 Contact with and (suspected) exposure to covid-19: Secondary | ICD-10-CM | POA: Insufficient documentation

## 2020-09-29 LAB — SARS CORONAVIRUS 2 (TAT 6-24 HRS): SARS Coronavirus 2: NEGATIVE

## 2020-10-01 MED ORDER — DEXTROSE 5 % IV SOLN
3.0000 g | Freq: Once | INTRAVENOUS | Status: AC
  Administered 2020-10-02: 3 g via INTRAVENOUS
  Filled 2020-10-01: qty 3

## 2020-10-02 ENCOUNTER — Encounter (HOSPITAL_COMMUNITY)
Admission: RE | Disposition: A | Discharge status: Home / Self Care | Payer: Self-pay | Source: Home / Self Care | Attending: Urology

## 2020-10-02 ENCOUNTER — Ambulatory Visit (HOSPITAL_COMMUNITY): Payer: 59

## 2020-10-02 ENCOUNTER — Ambulatory Visit (HOSPITAL_COMMUNITY): Payer: 59 | Admitting: Physician Assistant

## 2020-10-02 ENCOUNTER — Ambulatory Visit (HOSPITAL_COMMUNITY)
Admission: RE | Admit: 2020-10-02 | Discharge: 2020-10-02 | Disposition: A | Discharge status: Home / Self Care | Payer: 59 | Attending: Urology | Admitting: Urology

## 2020-10-02 ENCOUNTER — Ambulatory Visit (HOSPITAL_COMMUNITY): Payer: 59 | Admitting: Anesthesiology

## 2020-10-02 ENCOUNTER — Encounter (HOSPITAL_COMMUNITY): Payer: Self-pay | Admitting: Urology

## 2020-10-02 DIAGNOSIS — R351 Nocturia: Secondary | ICD-10-CM | POA: Insufficient documentation

## 2020-10-02 DIAGNOSIS — N401 Enlarged prostate with lower urinary tract symptoms: Secondary | ICD-10-CM | POA: Diagnosis not present

## 2020-10-02 DIAGNOSIS — R310 Gross hematuria: Secondary | ICD-10-CM | POA: Insufficient documentation

## 2020-10-02 DIAGNOSIS — N200 Calculus of kidney: Secondary | ICD-10-CM | POA: Insufficient documentation

## 2020-10-02 HISTORY — PX: CYSTOSCOPY/RETROGRADE/URETEROSCOPY/STONE EXTRACTION WITH BASKET: SHX5317

## 2020-10-02 LAB — GLUCOSE, CAPILLARY
Glucose-Capillary: 120 mg/dL — ABNORMAL HIGH (ref 70–99)
Glucose-Capillary: 66 mg/dL — ABNORMAL LOW (ref 70–99)

## 2020-10-02 SURGERY — CYSTOSCOPY, WITH CALCULUS REMOVAL USING BASKET
Anesthesia: General

## 2020-10-02 MED ORDER — PROMETHAZINE HCL 25 MG/ML IJ SOLN: 6.2500 mg | INTRAMUSCULAR | Status: DC | PRN

## 2020-10-02 MED ORDER — KETOROLAC TROMETHAMINE 30 MG/ML IJ SOLN: 30.0000 mg | Freq: Once | INTRAMUSCULAR | Status: DC

## 2020-10-02 MED ORDER — MIDAZOLAM HCL 2 MG/2ML IJ SOLN
INTRAMUSCULAR | Status: AC
  Filled 2020-10-02: qty 2

## 2020-10-02 MED ORDER — CEPHALEXIN 500 MG PO CAPS: 500.0000 mg | ORAL_CAPSULE | Freq: Two times a day (BID) | ORAL | 0 refills | Status: AC

## 2020-10-02 MED ORDER — SODIUM CHLORIDE 0.9 % IR SOLN
Status: DC | PRN
  Administered 2020-10-02: 6000 mL

## 2020-10-02 MED ORDER — HYDRALAZINE HCL 20 MG/ML IJ SOLN
5.0000 mg | Freq: Once | INTRAMUSCULAR | Status: AC
  Administered 2020-10-02: 5 mg via INTRAVENOUS

## 2020-10-02 MED ORDER — CHLORHEXIDINE GLUCONATE 0.12 % MT SOLN
15.0000 mL | Freq: Once | OROMUCOSAL | Status: AC
  Administered 2020-10-02: 15 mL via OROMUCOSAL

## 2020-10-02 MED ORDER — METOPROLOL TARTRATE 5 MG/5ML IV SOLN
INTRAVENOUS | Status: AC
  Filled 2020-10-02: qty 5

## 2020-10-02 MED ORDER — LACTATED RINGERS IV SOLN: INTRAVENOUS | Status: DC

## 2020-10-02 MED ORDER — ONDANSETRON HCL 4 MG/2ML IJ SOLN
INTRAMUSCULAR | Status: DC | PRN
  Administered 2020-10-02: 4 mg via INTRAVENOUS

## 2020-10-02 MED ORDER — FENTANYL CITRATE (PF) 100 MCG/2ML IJ SOLN
INTRAMUSCULAR | Status: AC
  Filled 2020-10-02: qty 2

## 2020-10-02 MED ORDER — ACETAMINOPHEN 10 MG/ML IV SOLN: 1000.0000 mg | Freq: Once | INTRAVENOUS | Status: DC | PRN

## 2020-10-02 MED ORDER — ORAL CARE MOUTH RINSE: 15.0000 mL | Freq: Once | OROMUCOSAL | Status: AC

## 2020-10-02 MED ORDER — MIDAZOLAM HCL 5 MG/5ML IJ SOLN
INTRAMUSCULAR | Status: DC | PRN
  Administered 2020-10-02: 2 mg via INTRAVENOUS

## 2020-10-02 MED ORDER — FENTANYL CITRATE (PF) 100 MCG/2ML IJ SOLN
25.0000 ug | INTRAMUSCULAR | Status: DC | PRN
  Administered 2020-10-02: 25 ug via INTRAVENOUS

## 2020-10-02 MED ORDER — OXYCODONE-ACETAMINOPHEN 5-325 MG PO TABS
1.0000 | ORAL_TABLET | ORAL | 0 refills | Status: DC | PRN
Start: 2020-10-02 — End: 2022-01-06

## 2020-10-02 MED ORDER — DOCUSATE SODIUM 100 MG PO CAPS: 100.0000 mg | ORAL_CAPSULE | Freq: Every day | ORAL | 0 refills | Status: AC | PRN

## 2020-10-02 MED ORDER — PROPOFOL 10 MG/ML IV BOLUS
INTRAVENOUS | Status: AC
  Filled 2020-10-02: qty 20

## 2020-10-02 MED ORDER — HYDRALAZINE HCL 20 MG/ML IJ SOLN: 5.0000 mg | Freq: Once | INTRAMUSCULAR | Status: AC

## 2020-10-02 MED ORDER — LABETALOL HCL 5 MG/ML IV SOLN
5.0000 mg | Freq: Once | INTRAVENOUS | Status: AC
  Administered 2020-10-02: 5 mg via INTRAVENOUS

## 2020-10-02 MED ORDER — LABETALOL HCL 5 MG/ML IV SOLN
INTRAVENOUS | Status: AC
  Administered 2020-10-02: 10 mg via INTRAVENOUS
  Filled 2020-10-02: qty 4

## 2020-10-02 MED ORDER — IOHEXOL 300 MG/ML  SOLN
INTRAMUSCULAR | Status: DC | PRN
  Administered 2020-10-02: 25 mL

## 2020-10-02 MED ORDER — LIDOCAINE 2% (20 MG/ML) 5 ML SYRINGE
INTRAMUSCULAR | Status: AC
  Filled 2020-10-02: qty 5

## 2020-10-02 MED ORDER — PROPOFOL 10 MG/ML IV BOLUS
INTRAVENOUS | Status: DC | PRN
  Administered 2020-10-02: 200 mg via INTRAVENOUS

## 2020-10-02 MED ORDER — FENTANYL CITRATE (PF) 100 MCG/2ML IJ SOLN
INTRAMUSCULAR | Status: DC | PRN
  Administered 2020-10-02: 25 ug via INTRAVENOUS
  Administered 2020-10-02: 50 ug via INTRAVENOUS
  Administered 2020-10-02: 25 ug via INTRAVENOUS

## 2020-10-02 MED ORDER — HYDRALAZINE HCL 20 MG/ML IJ SOLN
INTRAMUSCULAR | Status: AC
  Administered 2020-10-02: 5 mg via INTRAVENOUS
  Filled 2020-10-02: qty 1

## 2020-10-02 MED ORDER — OXYCODONE HCL 5 MG PO TABS: 5.0000 mg | ORAL_TABLET | Freq: Once | ORAL | Status: DC | PRN

## 2020-10-02 MED ORDER — OXYCODONE HCL 5 MG/5ML PO SOLN: 5.0000 mg | Freq: Once | ORAL | Status: DC | PRN

## 2020-10-02 MED ORDER — DEXAMETHASONE SODIUM PHOSPHATE 10 MG/ML IJ SOLN
INTRAMUSCULAR | Status: DC | PRN
  Administered 2020-10-02: 5 mg via INTRAVENOUS

## 2020-10-02 MED ORDER — LABETALOL HCL 5 MG/ML IV SOLN: 10.0000 mg | Freq: Once | INTRAVENOUS | Status: AC

## 2020-10-02 MED ORDER — OXYBUTYNIN CHLORIDE ER 5 MG PO TB24: 5.0000 mg | ORAL_TABLET | Freq: Every day | ORAL | 1 refills | Status: DC

## 2020-10-02 MED ORDER — LIDOCAINE 2% (20 MG/ML) 5 ML SYRINGE
INTRAMUSCULAR | Status: DC | PRN
  Administered 2020-10-02: 60 mg via INTRAVENOUS

## 2020-10-02 MED ORDER — ONDANSETRON HCL 4 MG/2ML IJ SOLN
INTRAMUSCULAR | Status: AC
  Filled 2020-10-02: qty 2

## 2020-10-02 MED ORDER — METOPROLOL TARTRATE 5 MG/5ML IV SOLN
3.0000 mg | Freq: Once | INTRAVENOUS | Status: AC
  Administered 2020-10-02: 3 mg via INTRAVENOUS

## 2020-10-02 MED ORDER — DEXTROSE 50 % IV SOLN
INTRAVENOUS | Status: AC
  Filled 2020-10-02: qty 50

## 2020-10-02 MED ORDER — DEXTROSE 50 % IV SOLN
0.5000 | Freq: Once | INTRAVENOUS | Status: AC
  Administered 2020-10-02: 25 mL via INTRAVENOUS

## 2020-10-02 SURGICAL SUPPLY — 25 items
BAG URO CATCHER STRL LF (MISCELLANEOUS) ×3 IMPLANT
BASKET ZERO TIP NITINOL 2.4FR (BASKET) ×2 IMPLANT
BSKT STON RTRVL ZERO TP 2.4FR (BASKET) ×1
CATH URET 5FR 28IN OPEN ENDED (CATHETERS) ×3 IMPLANT
CATH URET DUAL LUMEN 6-10FR 50 (CATHETERS) ×2 IMPLANT
CLOTH BEACON ORANGE TIMEOUT ST (SAFETY) ×3 IMPLANT
FIBER LASER MOSES 200 DFL (Laser) ×2 IMPLANT
GLOVE BIOGEL M 7.0 STRL (GLOVE) ×3 IMPLANT
GOWN STRL REUS W/TWL LRG LVL3 (GOWN DISPOSABLE) ×3 IMPLANT
GUIDEWIRE STR DUAL SENSOR (WIRE) ×6 IMPLANT
GUIDEWIRE ZIPWRE .038 STRAIGHT (WIRE) IMPLANT
IV NS 1000ML (IV SOLUTION) ×3
IV NS 1000ML BAXH (IV SOLUTION) ×1 IMPLANT
KIT TURNOVER KIT A (KITS) IMPLANT
LASER FIB FLEXIVA PULSE ID 365 (Laser) IMPLANT
MANIFOLD NEPTUNE II (INSTRUMENTS) ×3 IMPLANT
PACK CYSTO (CUSTOM PROCEDURE TRAY) ×3 IMPLANT
SHEATH URETERAL 12FRX35CM (MISCELLANEOUS) IMPLANT
SHEATH URETERAL 12FRX55CM (UROLOGICAL SUPPLIES) ×2 IMPLANT
STENT URET 6FRX26 CONTOUR (STENTS) ×4 IMPLANT
TRACTIP FLEXIVA PULS ID 200XHI (Laser) IMPLANT
TRACTIP FLEXIVA PULSE ID 200 (Laser)
TUBING CONNECTING 10 (TUBING) ×2 IMPLANT
TUBING CONNECTING 10' (TUBING) ×1
TUBING UROLOGY SET (TUBING) ×3 IMPLANT

## 2020-10-02 NOTE — Anesthesia Preprocedure Evaluation (Addendum)
Anesthesia Evaluation  Patient identified by MRN, date of birth, ID band Patient awake    Reviewed: Allergy & Precautions, NPO status , Patient's Chart, lab work & pertinent test results  Airway Mallampati: III  TM Distance: >3 FB Neck ROM: Full    Dental no notable dental hx.    Pulmonary asthma , sleep apnea and Continuous Positive Airway Pressure Ventilation ,    Pulmonary exam normal breath sounds clear to auscultation       Cardiovascular hypertension, Pt. on home beta blockers and Pt. on medications +CHF  Normal cardiovascular exam Rhythm:Regular Rate:Normal  ECG: NSR, rate 93  ECHO: ef 50%   Neuro/Psych Diabetic peripheral neuropathy  Neuromuscular disease negative psych ROS   GI/Hepatic negative GI ROS, Neg liver ROS,   Endo/Other  diabetes, Insulin Dependent, Oral Hypoglycemic AgentsMorbid obesity  Renal/GU Renal disease     Musculoskeletal Gout   Abdominal (+) + obese,   Peds  Hematology  (+) anemia , HLD   Anesthesia Other Findings LEFT RENAL STONE  Reproductive/Obstetrics                            Anesthesia Physical Anesthesia Plan  ASA: III  Anesthesia Plan: General   Post-op Pain Management:    Induction: Intravenous  PONV Risk Score and Plan: 2 and Ondansetron, Dexamethasone, Midazolam and Treatment may vary due to age or medical condition  Airway Management Planned: LMA  Additional Equipment:   Intra-op Plan:   Post-operative Plan: Extubation in OR  Informed Consent: I have reviewed the patients History and Physical, chart, labs and discussed the procedure including the risks, benefits and alternatives for the proposed anesthesia with the patient or authorized representative who has indicated his/her understanding and acceptance.     Dental advisory given  Plan Discussed with: CRNA  Anesthesia Plan Comments:        Anesthesia Quick Evaluation

## 2020-10-02 NOTE — Op Note (Signed)
Operative Note  Preoperative diagnosis:  1.  Left renal stone 2.  Right patulous mid ureter  Postoperative diagnosis: 1.  Left renal stone 2.  Right patulous mid ureter  Procedure(s): 1.  Cystoscopy 2.  Bilateral retrograde pyelogram. 3.  Right diagnostic ureteroscopy 4.  Left ureteroscopy with laser lithotripsy and basket extraction of stones. 5.  Bilateral ureteral stent placement 6.  Fluoroscopy with intraoperative interpretation  Surgeon: Jettie Pagan, MD  Assistants:  None  Anesthesia:  General  Complications:  None  EBL: Minimal  Specimens: 1.  Left renal stone  Drains/Catheters: 1.  6 French by 26 cm right ureteral stent 2.  6 French by 26 cm left ureteral stent  Intraoperative findings:   1. Cystoscopy identified a wide caliber bulbar urethral stenosis which was passable with the 21 French cystoscope.  He had mildly obstructing prostate. 2. Right retrograde pyelogram demonstrated a mid right ureteral stricture with some mild hydronephrosis proximal.  There was excellent draining of contrast. 3. Right diagnostic ureteroscopy identified several annular rings within the right mid ureter.  There is no lesion identified.  He has some mild amount of bleeding with some clots in the distal right ureter after the diagnostic ureteroscopy she elected to proceed with a right ureteral stent.  Successful right ureteral stent placed without a string. 4. Left ureteroscopy identified approximately 1.2 cm left renal stone in the left upper pole.  This was fragmented and basket extracted with the aid of an ureteral access sheath.  The rest of the fragments were tiny and irrigated away there were no fragments along the course of the ureter.  There is minimal trauma along the course of the ureter with the ureteral access sheath placed.  The distal left ureteral orifice was edematous after the procedure.  We elected to leave a left ureteral stent without a string.  Indication:  Miguel Ho is a 59 y.o. male with a 1.1 cm left renal stone on preoperative CT A/P.  After thorough discussion including all relevant risk benefits and alternatives, he elected to proceed with bilateral ureteroscopy with laser lithotripsy and basket extraction of his left renal stone.  Description of procedure: After informed consent was obtained from the patient, the patient was identified and taken to the operating room and placed in the supine position.  General anesthesia was administered as well as perioperative IV antibiotics.  At the beginning of the case, a time-out was performed to properly identify the patient, the surgery to be performed, and the surgical site.  Sequential compression devices were applied to the lower extremities at the beginning of the case for DVT prophylaxis.  The patient was then placed in the dorsal lithotomy supine position, prepped and draped in sterile fashion.  Preliminary scout fluoroscopy revealed that there was a 1.2 cm calcification area at the left upper pole, which corresponds to the  stone found on the preoperative CT scan. We then passed the 21-French rigid cystoscope through the urethra and into the bladder under vision without any difficulty , noting a normal urethra however there was a wide annular ring at the bulbar urethra that was navigated with the scope and a mildly obstructing prostate.  A systematic evaluation of the bladder revealed no evidence of any suspicious bladder lesions.  Ureteral orifices were in normal position.    We performed a right retrograde pyelogram demonstrating the findings as above with a mid right ureteral stricture.  We then passed a sensor wire up to level the renal  pelvis.  We then passed a semirigid ureteroscope identified several wide annular rings within the mid ureter.  There were no lesions here so we elected not to biopsy this.  We did remove our semirigid ureteroscope.  The of the case there were some clots at the distal  right ureter.  We passed a wire beyond this with then drainage of the system.  Therefore we elected to leave a 6 [place] by 26 cm right ureteral stent without a string.  This will be in place for 2 weeks.  In a similar fashion we performed a left retrograde pyelogram demonstrating no filling defects, no hydronephrosis and a 1.2 cm left upper pole renal stone.  We then passed a sensor wire up to level the renal pelvis.  We then passed the semirigid ureteroscope into the ureter where we identified no stone fragments with along the course of the ureter.  These passed a separate 0.038 sensor wire up to level renal pelvis.  Over the sensor wire we then passed a ureteral access sheath which was 12/14 [place] into the left kidney.  The other sensor wire circular safety wire.  The calculus was identified at the left upper pole. Using the 272 micron holmium laser fiber, the stone was fragmented completely. A 2.2 Fr zero tip basket was used to remove the fragments under visual guidance. These were sent for chemical analysis. With the ureteroscope in the kidney, a gentle pyelogram was performed to delineate the calyceal system and we evaluated the calyces systematically. We encountered no further stones. The rest of the stone fragments were very tiny and these were  irrigated away gently. The calyces were re-inspected and there were no significant stone fragment residual.   We then withdrew the ureteroscope back down the ureter along with the access sheath, noting no evidence of any stones along the course of the ureter.  There is no significant trauma along the course of the ureter. Prior to removing the ureteroscope, we did pass the Glidewire back up to the ureter to the renal pelvis. Once the ureteroscope was removed, the Glidewire was backloaded through the rigid cystoscope, which was then advanced down the urethra and into the bladder. We then used the Glidewire under direct vision through the rigid cystoscope and  under fluoroscopic guidance and passed up a 6-French, 26 cm double-pigtail ureteral stent up ureter, making sure that the proximal and distal ends coiled within the kidney and bladder respectively.  Note that we did not leave a string attached. The cystoscope was then removed after the bladder was drained.  The patient tolerated the procedure well and there was no complication. Patient was awoken from anesthesia and taken to the recovery room in stable condition. I was present and scrubbed for the entirety of the case.  Plan:  Patient will be discharged home.  Follow up with me in 2 weeks for bilateral ureteral stent removal.  Matt R. Tawsha Terrero MD [facility]  Pager: [phone]

## 2020-10-02 NOTE — Transfer of Care (Signed)
Immediate Anesthesia Transfer of Care Note  Patient: Miguel Ho  Procedure(s) Performed: CYSTOSCOPY/BILATERAL RETROGRADE/BILATERAL URETEROSCOPY/LEFT STONE EXTRACTION WITH BASKET/LEFT HOLMIUM LASER LITHOTRIPSY/BILATERAL STENT PLACEMENT (N/A )  Patient Location: PACU  Anesthesia Type:General  Level of Consciousness: awake, alert  and oriented  Airway & Oxygen Therapy: Patient Spontanous Breathing and Patient connected to face mask oxygen  Post-op Assessment: Report given to RN and Post -op Vital signs reviewed and stable  Post vital signs: Reviewed and stable  Last Vitals:  Vitals Value Taken Time  BP    Temp    Pulse    Resp    SpO2      Last Pain:  Vitals:   10/02/20 0947  TempSrc: Oral  PainSc:          Complications: No complications documented.

## 2020-10-02 NOTE — H&P (Signed)
Office Visit Report 08/03/2020    Miguel Ho. Febus         MRN: [id number] PRIMARY CARE:  Ralene Ok, MD   REFERRING:  Ralene Ok, MD  DOB: 1960/11/27, 59 year old Male PROVIDER:  Jettie Pagan, M.D.  SSN: [id number] LOCATION:  [facility] [phone]    CC/HPI: Miguel Ho is a very pleasant 59 year old male who is seen in follow-up today for an episode of gross hematuria.   He presents to review his recent CT abdomen pelvis 07/28/2020 that revealed a 1.1 cm nonobstructive calculus in the midportion of the left kidney as well as a focally patulous segment of the mid right ureter measuring up to 1.2 cm possibility of bladder calculus. I measured his prostate at 5.3 x 3.3 x 4.6 cm equating to an approximately 40 g prostate. His PSA on 07/09/2020 was 1.72. His creatinine on 07/09/2020 is slightly elevated at 1.2.   He initially presented with an episode of gross hematuria approximately a month ago. He was then dx and treated for a UTI.   He also reports some mild lower urinary tract symptoms including intermittent frequency and urgency. He reports a good flow stream. He did he sometimes has nocturia up to 3 times. His IPSS score today is 10. He denies a family history of prostate cancer or other urologic malignancies.   Patient currently denies fever, chills, sweats, nausea, vomiting, abdominal or flank pain, gross hematuria or dysuria. [facility]. He is not taking any anticoagulation.      ALLERGIES: No Allergies    MEDICATIONS: Allopurinol 300 mg tablet  Metformin Hcl 500 mg tablet  Aspirin 81 MG TABS Oral  Carvedilol 25 mg tablet  Furosemide 40 mg tablet  Gabapentin 300 mg capsule  GlipiZIDE TABS Oral  Hydralazine Hcl 50 mg tablet  Lasix TABS Oral  Levemir Flextouch 100 unit/ml (3 ml) insulin pen  Lisinopril TABS Oral  Lovastatin 40 mg tablet  Ozempic  Potassium Chloride 20 meq tablet, ext release, particles/crystals  Potassium TABS Oral   Sacubitril-Valsartan     GU PSH: Locm 300-399Mg /Ml Iodine,1Ml - 07/28/2020       PSH Notes: Surg Penis Division Of Penile Adhesions, Tonsillectomy   NON-GU PSH: Remove Tonsils - 2011         GU PMH: Gross hematuria - 07/28/2020, - 07/09/2020 Acute Cystitis/UTI - 07/09/2020 Encounter for Prostate Cancer screening - 07/09/2020 Prostate nodule w/ LUTS - 07/09/2020 Renal calculus - 07/09/2020 Renal cyst - 07/09/2020 Urinary Frequency - 07/09/2020 Other microscopic hematuria, Microscopic hematuria - 2014      PMH Notes:  1898-11-14 00:00:00 - Note: Normal Routine History And Physical Adult  2010-10-13 09:19:05 - Note: Gout  2010-10-13 09:19:05 - Note: Heartburn With Regurgitation   NON-GU PMH: Personal history of other diseases of the circulatory system, History of hypertension - 2014 Personal history of other diseases of the nervous system and sense organs, History of sleep apnea - 2014 Personal history of other endocrine, nutritional and metabolic disease, History of diabetes mellitus - 2014, History of hypercholesterolemia, - 2014    FAMILY HISTORY: Cardiac Failure - Father Death In The Family Father - Father Diabetes - Father Family Health Status - Mother's Age - Runs In Family Family Health Status Number - Runs In Family Hematuria - Father   SOCIAL HISTORY: Marital Status: Married Ethnicity: Not Hispanic Or Latino; Race: Black or African American Current Smoking Status: Patient has never smoked.  Has never  drank.  Drinks 1 caffeinated drink per day.     Notes: Alcohol Use, Occupation:, Marital History - Currently Married, Tobacco Use, Caffeine Use   REVIEW OF SYSTEMS:     GU Review Male:  Patient denies frequent urination, hard to postpone urination, burning/ pain with urination, get up at night to urinate, leakage of urine, stream starts and stops, trouble starting your stream, have to strain to urinate , erection problems, and penile pain.    Gastrointestinal (Upper):  Patient  denies nausea, indigestion/ heartburn, and vomiting.    Gastrointestinal (Lower):  Patient denies diarrhea and constipation.    Constitutional:  Patient denies fever, night sweats, weight loss, and fatigue.    Skin:  Patient denies skin rash/ lesion and itching.    Eyes:  Patient denies blurred vision and double vision.    Ears/ Nose/ Throat:  Patient denies sore throat and sinus problems.    Hematologic/Lymphatic:  Patient denies swollen glands and easy bruising.    Cardiovascular:  Patient denies leg swelling and chest pains.    Respiratory:  Patient denies cough and shortness of breath.    Endocrine:  Patient denies excessive thirst.    Musculoskeletal:  Patient denies back pain and joint pain.    Neurological:  Patient denies headaches and dizziness.    Psychologic:  Patient denies depression and anxiety.    VITAL SIGNS:       08/03/2020 09:19 AM     Weight 260 lb / 117.93 kg     Height 69 in / 175.26 cm     BP 125/78 mmHg     Pulse 96 /min     Temperature 97.1 F / 36.1 C     BMI 38.4 kg/m     GU PHYSICAL EXAMINATION:      Scrotum: No lesions. No edema. No cysts. No warts.     Epididymides: Right: no spermatocele, no masses, no cysts, no tenderness, no induration, no enlargement. Left: no spermatocele, no masses, no cysts, no tenderness, no induration, no enlargement.     Testes: No tenderness, no swelling, no enlargement left testes. No tenderness, no swelling, no enlargement right testes. Normal location left testes. Normal location right testes. No mass, no cyst, no varicocele, no hydrocele left testes. No mass, no cyst, no varicocele, no hydrocele right testes.     Urethral Meatus: Normal size. No lesion, no wart, no discharge, no polyp. Normal location.     Penis: Circumcised, no warts, no cracks. No dorsal Peyronie's plaques, no left corporal Peyronie's plaques, no right corporal Peyronie's plaques, no scarring, no warts. No balanitis, no meatal stenosis.     MULTI-SYSTEM  PHYSICAL EXAMINATION:      Constitutional: Well-nourished. No physical deformities. Normally developed. Good grooming.     Respiratory: No labored breathing, no use of accessory muscles.      Cardiovascular: Normal temperature, normal extremity pulses, no swelling, no varicosities.     Gastrointestinal: No mass, no tenderness, no rigidity, non obese abdomen.            Complexity of Data:   Source Of History:  Patient, Medical Record Summary  Lab Test Review:  PSA  Records Review:  AUA Symptom Score, Previous Doctor Records, Previous Hospital Records  Urine Test Review:  Urinalysis  Urodynamics Review:  Review Bladder Scan  X-Ray Review: C.T. Abdomen/Pelvis: Reviewed Films. Reviewed Report.      07/09/20  PSA  Total PSA 1.72 ng/mL   Notes:  CLINICAL DATA: Microhematuria  EXAM:  CT ABDOMEN AND PELVIS WITHOUT AND WITH CONTRAST   TECHNIQUE:  Multidetector CT imaging of the abdomen and pelvis was performed  following the standard protocol before and following the bolus  administration of intravenous contrast.   CONTRAST: 125 mL Omnipaque 300 iodinated contrast IV   COMPARISON: None.   FINDINGS:  Lower chest: No acute abnormality.   Hepatobiliary: No solid liver abnormality is seen. No gallstones,  gallbladder wall thickening, or biliary dilatation.   Pancreas: Unremarkable. No pancreatic ductal dilatation or  surrounding inflammatory changes.   Spleen: Normal in size without significant abnormality.   Adrenals/Urinary Tract: Adrenal glands are unremarkable.  Nonobstructive calculus in the midportion of the left kidney  measuring 1.1 cm. No hydronephrosis. There is a focally patulous  segment of the mid right ureter measuring up to 1.2 cm in caliber  (series 11, image 31,, series 607, image 86). No renal mass or  urinary tract filling defect. Bladder is unremarkable.   Stomach/Bowel: Stomach is within normal limits. Appendix appears  normal. No evidence of bowel  wall thickening, distention, or  inflammatory changes. Occasional colonic diverticula.   Vascular/Lymphatic: Aortic atherosclerosis. No enlarged abdominal or  pelvic lymph nodes.   Reproductive: No mass or other significant abnormality.   Other: Fat containing umbilical hernia. No abdominopelvic ascites.   Musculoskeletal: No acute or significant osseous findings.   IMPRESSION:  1. Nonobstructive calculus in the midportion of the left kidney  measuring 1.1 cm. No hydronephrosis.  2. There is a focally patulous segment of the mid right ureter  measuring up to 1.2 cm in caliber, of uncertain significance,  possibly related to prior calculus. No obstruction or other  abnormality identified.  3. No suspicious mass or urinary tract filling defect.  4. Aortic Atherosclerosis (ICD10-I70.0).    Electronically Signed  By: Lauralyn Primes M.D.  On: 07/29/2020 09:19   PROCEDURES:    Flexible Cystoscopy - 52000  Risks, benefits, and some of the potential complications of the procedure were discussed at length with the patient including infection, bleeding, voiding discomfort, urinary retention, fever, chills, sepsis, and others. All questions were answered. Informed consent was obtained. Antibiotic prophylaxis was given. Sterile technique and intraurethral analgesia were used.  Meatus:  Normal size. Normal location. Normal condition.  Urethra:  No strictures.  External Sphincter:  Normal.  Verumontanum:  Normal.  Prostate:  Mild bilobar obstruction  Bladder Neck:  Non-obstructing.  Ureteral Orifices:  Normal location. Normal size. Normal shape. Effluxed clear urine.  Bladder:  No trabeculation. No tumors. Normal mucosa. No stones. Debris present within the bladder.      The lower urinary tract was carefully examined. The procedure was well-tolerated and without complications. Antibiotic instructions were given. Instructions were given to call the office immediately for bloody urine,  difficulty urinating, urinary retention, painful or frequent urination, fever, chills, nausea, vomiting or other illness. The patient stated that he understood these instructions and would comply with them.    Flow Rate - 51741  Peak Flow Rate: 16.9 cc/sec  Flow Time: 0:30 min:sec  Time of Peak Flow: 0:06 min:sec  Voided Volume: 276 cc  Average Flow Rate: 9.1 cc/sec  Total Void Time: 0:31 min:sec      PVR Ultrasound - 76734  Voided Volume: 276 cc  Scanned Volume: 26 cc    ASSESSMENT:     ICD-10 Details  1 GU:  Renal calculus - N20.0   2  Gross hematuria - R31.0   3  BPH w/LUTS -  N40.1   4  Nocturia - R35.1   5  Encounter for Prostate Cancer screening - Z12.5    PLAN:   Medications  New Meds: Tamsulosin Hcl 0.4 mg capsule 1 capsule PO Q HS #90 3 Refill(s)    Orders  Labs CULTURE, URINE  Schedule  Return Visit/Planned Activity: 1 Year - Office Visit, KUB  Document  Letter(s):  Created for Patient: Clinical Summary   Notes:  1. Left renal stone measuring 1.1 cm: Left interpolar, nonobstructing, no hydronephrosis.   We discussed the options for management of kidney stones, including observation, ESWL, ureteroscopy with laser lithotripsy, and PCNL. The risks and benefits of each option were discussed. ESWL: risks and benefits of ESWL were outlined including infection, bleeding, pain, steinstrasse, kidney injury, need for ancillary treatments, and global anesthesia risks including but not limited to CVA, MI, DVT, PE, pneumonia, and death. Ureteroscopy: risks and benefits of ureteroscopy were outlined, including infection, bleeding, pain, temporary ureteral stent and associated stent bother, ureteral injury, ureteral stricture, need for ancillary treatments, and global anesthesia risks including but not limited to CVA, MI, DVT, PE, pneumonia, and death. PCNL: risks and benefits of PCNL were outlined including infection, bleeding, blood transfusion, pain, pneumothorax, bowel injury,  persistent urine leak, need for ancillary treatments, and global anesthesia risks including but not limited to CVA, MI, DVT, PE, pneumonia, and death.   This time, patient elects proceed with ureteroscopy with laser lithotripsy for treatment of his left renal stone. The same time we will proceed with right diagnostic ureteroscopy to evaluate his wide caliber mid right ureter. Surgery letter sent.   2. Right hydroureteronephrosis: Focally patulous segment of the mid right ureter seen on recent CT abdomen pelvis 07/29/2020. Recommend right retrograde pyelogram and diagnostic right ureteroscopy during treatment of his left-sided stone.   3. Gross hematuria: CT abdomen pelvis as above demonstrating left renal stone. Cystoscopy today NED.   4. BPH with LUTS: Prostate measures 40 g on CT abdomen pelvis 07/29/2020. Predominant urinary frequency, urgency, nocturia. IPSS 10. Starting a trial of flomax.   5. Prostate cancer screening: DRE smooth, a nodular. PSA 1.2 on 07/09/2020. Check PSA in 1 year.      Signed by Jettie Pagan, M.D. on 08/03/20 at 12:44 PM (EDT)   Urology Preoperative H&P   Chief Complaint: Left flank pain  History of Present Illness: Miguel Ho is a 59 y.o. male with a left renal stone measuring 1.1 cm.  He is here for cystoscopy, bilateral ureteroscopy with laser lithotripsy on the left, basket extraction of stones on the left, left ureteral stent placement.  CT A/P demonstrated focally patulous segment in the mid right ureter which we would diagnose with retrograde pyelogram and diagnostic ureteroscopy.  He denies any recent fevers or chills.    Past Medical History:  Diagnosis Date  . CHF (congestive heart failure) (HCC)   . Childhood asthma    no longer an issue   . Chronic combined systolic and diastolic heart failure (HCC) 10/12/2015  . Diabetic peripheral neuropathy (HCC)   . Essential hypertension 10/12/2015  . GERD (gastroesophageal reflux disease)    no longer  an issue   . Gout    "on daily RX" (11/09/2017)  . History of kidney stones   . Hyperlipidemia 10/12/2015  . Morbid obesity (HCC) 10/12/2015  . OSA on CPAP   . Type II diabetes mellitus (HCC)     Past Surgical History:  Procedure Laterality Date  . CARDIAC CATHETERIZATION  2003   nl cors, Dr Elsie Lincoln  . RIGHT/LEFT HEART CATH AND CORONARY ANGIOGRAPHY N/A 11/13/2017   Procedure: RIGHT/LEFT HEART CATH AND CORONARY ANGIOGRAPHY;  Surgeon: [name], MD;  Location: [facility];  Service: Cardiovascular;  Laterality: N/A;  . TONSILLECTOMY      Allergies: No Known Allergies  Family History  Problem Relation Age of Onset  . Heart attack Father        deceased age 3  . Hypertension Father   . Diabetes Father   . Heart disease Father   . Cancer Sister        leukemia  . Heart attack Mother        deceased age 56  . Heart attack Paternal Grandmother     Social History:  reports that he has never smoked. He has never used smokeless tobacco. He reports previous drug use. He reports that he does not drink alcohol.  ROS: A complete review of systems was performed.  All systems are negative except for pertinent findings as noted.  Physical Exam:  Vital signs in last 24 hours:   Constitutional:  Alert and oriented, No acute distress Cardiovascular: Regular rate and rhythm Respiratory: Normal respiratory effort, Lungs clear bilaterally GI: Abdomen is soft, nontender, nondistended, no abdominal masses GU: No CVA tenderness Lymphatic: No lymphadenopathy Neurologic: Grossly intact, no focal deficits Psychiatric: Normal mood and affect  Laboratory Data:  No results for input(s): WBC, HGB, HCT, PLT in the last 72 hours.  No results for input(s): NA, K, CL, GLUCOSE, BUN, CALCIUM, CREATININE in the last 72 hours.  Invalid input(s): CO3   No results found for this or any previous visit (from the past 24 hour(s)). Recent Results (from the past 240 hour(s))  SARS  CORONAVIRUS 2 (TAT 6-24 HRS) Nasopharyngeal Nasopharyngeal Swab     Status: None   Collection Time: 09/29/20  9:40 AM   Specimen: Nasopharyngeal Swab  Result Value Ref Range Status   SARS Coronavirus 2 NEGATIVE NEGATIVE Final    Comment: (NOTE) SARS-CoV-2 target nucleic acids are NOT DETECTED.  The SARS-CoV-2 RNA is generally detectable in upper and lower respiratory specimens during the acute phase of infection. Negative results do not preclude SARS-CoV-2 infection, do not rule out co-infections with other pathogens, and should not be used as the sole basis for treatment or other patient management decisions. Negative results must be combined with clinical observations, patient history, and epidemiological information. The expected result is Negative.  Fact Sheet for Patients: HairSlick.no  Fact Sheet for Healthcare Providers: quierodirigir.com  This test is not yet approved or cleared by the [place] FDA and  has been authorized for detection and/or diagnosis of SARS-CoV-2 by FDA under an Emergency Use Authorization (EUA). This EUA will remain  in effect (meaning this test can be used) for the duration of the COVID-19 declaration under Se ction 564(b)(1) of the Act, 21 U.S.C. section 360bbb-3(b)(1), unless the authorization is terminated or revoked sooner.  Performed at [facility], [address] [place], [place] [address]     Renal Function: No results for input(s): CREATININE in the last 168 hours. Estimated Creatinine Clearance: 53.9 mL/min (A) (by C-G formula based on SCr of 1.88 mg/dL (H)).  Radiologic Imaging: No results found.  I independently reviewed the above imaging studies.  Assessment and Plan Miguel Ho is a 59 y.o. male with left renal stone measuring 1.1 cm.  He is here for cystoscopy, bilateral ureteroscopy with laser lithotripsy on  the left, basket extraction of stones on the  left, left ureteral stent placement.  CT A/P demonstrated focally patulous segment in the mid right ureter which we would diagnose with retrograde pyelogram and diagnostic ureteroscopy.   Ureteroscopy: risks and benefits of ureteroscopy were outlined, including infection, bleeding, pain, temporary ureteral stent and associated stent bother, ureteral injury, ureteral stricture, need for ancillary treatments, and global anesthesia risks including but not limited to CVA, MI, DVT, PE, pneumonia, and death.  Matt R. Jarrah Seher MD 2020/10/24, 9:20 AM  [facility] Pager: [phone]): [phone]

## 2020-10-02 NOTE — Progress Notes (Signed)
Dr. Okey Dupre with anesthesia notified Pt BP still running 171/109 after last order for lopressor 3mg . Dr. states Pt OK to send home. Pt made aware to monitor Bps at home tonight and call MD or hospital for any low readings.

## 2020-10-02 NOTE — Discharge Instructions (Signed)
   Activity:  You are encouraged to ambulate frequently (about every hour during waking hours) to help prevent blood clots from forming in your legs or lungs.  However, you should not engage in any heavy lifting (> 10-15 lbs), strenuous activity, or straining.   Diet: You should advance your diet as instructed by your physician.  It will be normal to have some bloating, nausea, and abdominal discomfort intermittently.   Prescriptions:  You will be provided a prescription for pain medication to take as needed.  If your pain is not severe enough to require the prescription pain medication, you may take extra strength Tylenol instead which will have less side effects.  You should also take a prescribed stool softener to avoid straining with bowel movements as the prescription pain medication may constipate you.   What to call us about: You should call the office [phone]) if you develop fever > 101 or develop persistent vomiting. Activity:  You are encouraged to ambulate frequently (about every hour during waking hours) to help prevent blood clots from forming in your legs or lungs.  However, you should not engage in any heavy lifting (> 10-15 lbs), strenuous activity, or straining.  You have a left and right ureteral stent draining your kidney. You will f/u on 12/3 at 10AM for removal in office.

## 2020-10-02 NOTE — Anesthesia Procedure Notes (Signed)
Procedure Name: LMA Insertion Date/Time: 10/02/2020 12:06 PM Performed by: Orest Dikes, CRNA Pre-anesthesia Checklist: Patient identified, Emergency Drugs available, Suction available and Patient being monitored Patient Re-evaluated:Patient Re-evaluated prior to induction Oxygen Delivery Method: Circle system utilized Preoxygenation: Pre-oxygenation with 100% oxygen Induction Type: IV induction LMA: LMA inserted LMA Size: 5.0 Number of attempts: 1 Dental Injury: Teeth and Oropharynx as per pre-operative assessment  Comments: Placed by Tollie Eth

## 2020-10-04 NOTE — Anesthesia Postprocedure Evaluation (Signed)
Anesthesia Post Note  Patient: Miguel Ho  Procedure(s) Performed: CYSTOSCOPY/BILATERAL RETROGRADE/BILATERAL URETEROSCOPY/LEFT STONE EXTRACTION WITH BASKET/LEFT HOLMIUM LASER LITHOTRIPSY/BILATERAL STENT PLACEMENT (N/A )     Patient location during evaluation: PACU Anesthesia Type: General Level of consciousness: awake and alert Pain management: pain level controlled Vital Signs Assessment: post-procedure vital signs reviewed and stable Respiratory status: spontaneous breathing, nonlabored ventilation, respiratory function stable and patient connected to nasal cannula oxygen Cardiovascular status: blood pressure returned to baseline and stable Postop Assessment: no apparent nausea or vomiting Anesthetic complications: no Comments: Patient hypertensive in the PACU despite multiple rounds of IV medications. Patient denies headache, dizziness, or blurry vision.    No complications documented.  Last Vitals:  Vitals:   10/02/20 1715 10/02/20 1730  BP: (!) 171/109 (!) 165/103  Pulse: 89 94  Resp:    Temp:  37 C  SpO2: 95% 93%    Last Pain:  Vitals:   10/02/20 1730  TempSrc:   PainSc: 1                  Ethie Curless P Yacqub Baston

## 2020-10-05 ENCOUNTER — Encounter (HOSPITAL_COMMUNITY): Payer: Self-pay | Admitting: Urology

## 2020-10-14 NOTE — Progress Notes (Signed)
[facility] PREP Weekly Session   Patient Details  Name: Miguel Ho MRN: [id number] Date of Birth: 1960-12-24 Age: 59 y.o. PCP: Ralene Ok, MD  Vitals:   10/14/20 1023  Weight: 261 lb (118.4 kg)     [facility] Weekly seesion - 10/14/20 1000      Weekly Session   Topic Discussed Restaurant Eating   salt/sugar demo   Minutes exercised this week 45 minutes    Classes attended to date 5         @ [address]  Fun things since last meeting: Travel and lots of walking Grateful for: working out, exercising Nutrition celebration: fruits, vegetables, very little meats Barriers/struggles: losing weight and working for me now   [employer] 10/14/2020, 10:24 AM

## 2020-10-21 NOTE — Progress Notes (Signed)
[facility] PREP Weekly Session   Patient Details  Name: Miguel Ho MRN: [id number] Date of Birth: September 14, 1961 Age: 59 y.o. PCP: Ralene Ok, MD  Vitals:   10/21/20 1026  Weight: 259 lb (117.5 kg)     [facility] Weekly seesion - 10/21/20 1000      Weekly Session   Topic Discussed Stress management and problem solving    Minutes exercised this week 40 minutes    Classes attended to date 7          PREP class on 10/20/20 late entry At [facility] Fun things since last meeting: walking, talking Grateful for: healthy, exercising Nutrition celebration: salads, lots of water  Already 15 lbs down in program.   Bonnye Fava 10/21/2020, 10:28 AM

## 2020-10-28 NOTE — Progress Notes (Signed)
[facility] PREP Weekly Session   Patient Details  Name: Miguel Ho MRN: [id number] Date of Birth: [birth year] Age: 59 y.o. PCP: Ralene Ok, MD  Vitals:   10/28/20 0932  Weight: 259 lb 4.8 oz (117.6 kg)     [facility] Weekly seesion - 10/28/20 0900      Weekly Session   Topic Discussed Expectations and non-scale victories    Minutes exercised this week 280 minutes    Classes attended to date 8   Talked about ways to stay active while out of town         Grateful for: wake up in spirit Nutrition celebration: healthy food to enjoy Barriers/struggles: time to keep in shape    Bonnye Fava 10/28/2020, 9:34 AM

## 2020-11-04 NOTE — Progress Notes (Signed)
[facility] PREP Weekly Session   Patient Details  Name: Miguel Ho MRN: [id number] Date of Birth: 17-Oct-1961 Age: 59 y.o. PCP: Ralene Ok, MD  Vitals:   11/03/20 1830  Weight: 259 lb (117.5 kg)     [facility] Weekly seesion - 11/04/20 0900      Weekly Session   Topic Discussed Other   Portion control   Minutes exercised this week 30 minutes   Walked all weekend while out of town   Classes attended to date 85         PREP class 12/21 at [facility]  Fun things since last meeting: traveling and lots of walking Grateful for: Healthy week Nutrition celebration: more vegetable than meats Barriers/struggles: none at this time.    Bonnye Fava 11/04/2020, 9:38 AM

## 2020-11-11 NOTE — Progress Notes (Signed)
[facility] PREP Weekly Session   Patient Details  Name: Miguel Ho MRN: [id number] Date of Birth: Aug 03, 1961 Age: 59 y.o. PCP: Ralene Ok, MD  Vitals:   11/10/20 1830  Weight: 258 lb (117 kg)     [facility] Weekly seesion - 11/11/20 1200      Weekly Session   Topic Discussed Finding support    Minutes exercised this week 45 minutes    Classes attended to date 54         PREP class at [place] on 11/10/20 Fun things since last meeting: walking Grateful for: healthy life  Nutrition celebration: fruits and vegetables   Bonnye Fava 11/11/2020, 12:16 PM

## 2020-11-25 NOTE — Progress Notes (Signed)
[facility] PREP Weekly Session   Patient Details  Name: Miguel Ho MRN: [id number] Date of Birth: Dec 14, 1960 Age: 60 y.o. PCP: Ralene Ok, MD  Vitals:   11/25/20 0945  Weight: 261 lb (118.4 kg)     [facility] Weekly seesion - 11/25/20 0900      Weekly Session   Topic Discussed Hitting roadblocks    Classes attended to date 14          PREP class 11/24/20 at [facility] Fun things since last meeting: travel with family, lots of walking Grateful for: being healthy Nutrition celebration: vegetables and fruits    Bonnye Fava 11/25/2020, 9:46 AM

## 2020-12-09 NOTE — Progress Notes (Signed)
[facility] PREP Weekly Session   Patient Details  Name: Miguel Ho MRN: [id number] Date of Birth: 1961-06-22 Age: 60 y.o. PCP: Ralene Ok, MD  There were no vitals filed for this visit.   [facility] Weekly seesion - 12/09/20 1400      Weekly Session   Topic Discussed --   Final fit testing   Minutes exercised this week --   walked 3 miles   Classes attended to date 15          Class on 12/08/20 at [facility]  Fun things since last meeting: walking Grateful for: healthy days Nutrition celebration: vegetables and fruits Barriers/struggles: finding time  Bonnye Fava 12/09/2020, 2:54 PM

## 2020-12-11 NOTE — Progress Notes (Signed)
[facility] PREP Progress Report   Patient Details  Name: Miguel Ho MRN: [id number] Date of Birth: [birth year] Age: 60 y.o. PCP: Ralene Ok, MD  Vitals:   12/10/20 1955  BP: 126/84  Pulse: (!) 103  SpO2: 96%  Weight: 264 lb 12.8 oz (120.1 kg)      [facility] Eval - 12/11/20 1900      Referral    Program Start Date --   final PREP class 12/10/20     Measurement   Neck measurement 17 Inches    Waist Circumference 53.5 inches    Body fat 33.7 percent      Mobility and Daily Activities   I find it easy to walk up or down two or more flights of stairs. 2    I have no trouble taking out the trash. 3    I do housework such as vacuuming and dusting on my own without difficulty. 4    I can easily lift a gallon of milk (8lbs). 4    I can easily walk a mile. 4    I have no trouble reaching into high cupboards or reaching down to pick up something from the floor. 4    I do not have trouble doing out-door work such as Loss adjuster, chartered, raking leaves, or gardening. 3      Mobility and Daily Activities   I feel younger than my age. 2    I feel independent. 3    I feel energetic. 2    I live an active life.  3    I feel strong. 3    I feel healthy. 3    I feel active as other people my age. 3      How fit and strong are you.   Fit and Strong Total Score 43          Past Medical History:  Diagnosis Date  . CHF (congestive heart failure) (HCC)   . Childhood asthma    no longer an issue   . Chronic combined systolic and diastolic heart failure (HCC) 10/12/2015  . Diabetic peripheral neuropathy (HCC)   . Essential hypertension 10/12/2015  . GERD (gastroesophageal reflux disease)    no longer an issue   . Gout    "on daily RX" (11/09/2017)  . History of kidney stones   . Hyperlipidemia 10/12/2015  . Morbid obesity (HCC) 10/12/2015  . OSA on CPAP   . Type II diabetes mellitus (HCC)    Past Surgical History:  Procedure Laterality Date  . CARDIAC CATHETERIZATION   2003   nl cors, Dr Elsie Lincoln  . CYSTOSCOPY/RETROGRADE/URETEROSCOPY/STONE EXTRACTION WITH BASKET N/A 10/02/2020   Procedure: CYSTOSCOPY/BILATERAL RETROGRADE/BILATERAL URETEROSCOPY/LEFT STONE EXTRACTION WITH BASKET/LEFT HOLMIUM LASER LITHOTRIPSY/BILATERAL STENT PLACEMENT;  Surgeon: Jannifer Hick, MD;  Location: [facility];  Service: Urology;  Laterality: N/A;  . RIGHT/LEFT HEART CATH AND CORONARY ANGIOGRAPHY N/A 11/13/2017   Procedure: RIGHT/LEFT HEART CATH AND CORONARY ANGIOGRAPHY;  Surgeon: [name], MD;  Location: [facility] INVASIVE CV [facility];  Service: Cardiovascular;  Laterality: N/A;  . TONSILLECTOMY     Social History   Tobacco Use  Smoking Status Never Smoker  Smokeless Tobacco Never Used   Marked improvement in cardio, strength and balance  Assisted with next 90 day planning and has already signed up for swimming classes.  Going meat and dairy free    Bonnye Fava 12/11/2020, 7:59 PM

## 2020-12-30 IMAGING — US US RENAL
1 series · 14 of 25 positions shown · non-contrast
Comparison: None.

CLINICAL DATA: Stage III chronic renal disease

EXAM:
RENAL / URINARY TRACT ULTRASOUND COMPLETE

[Series 1: us renal · 0.30mm/px · 14 of 59 slices shown]
[im 1/59]
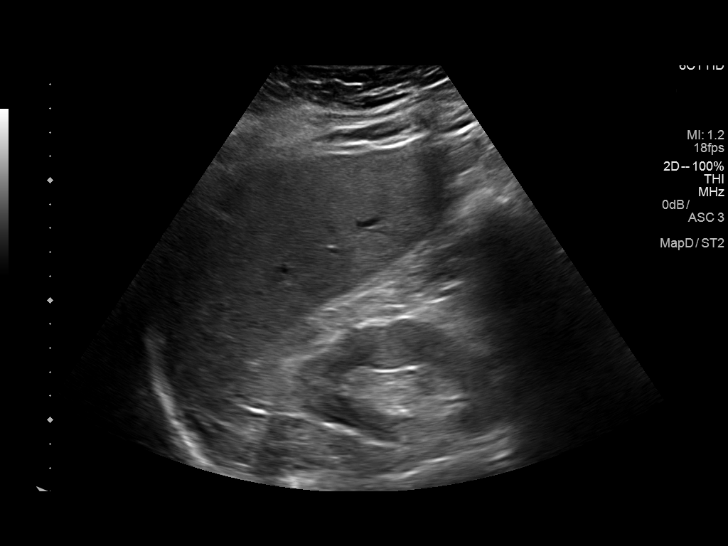
[im 5/59]
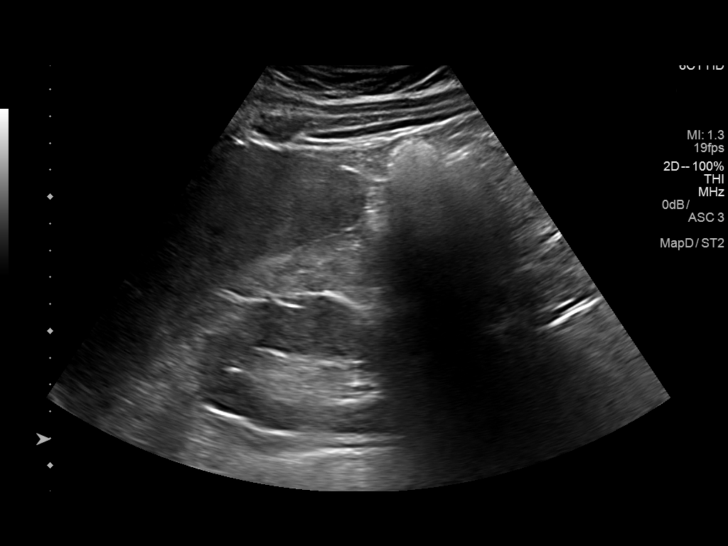
[im 10/59]
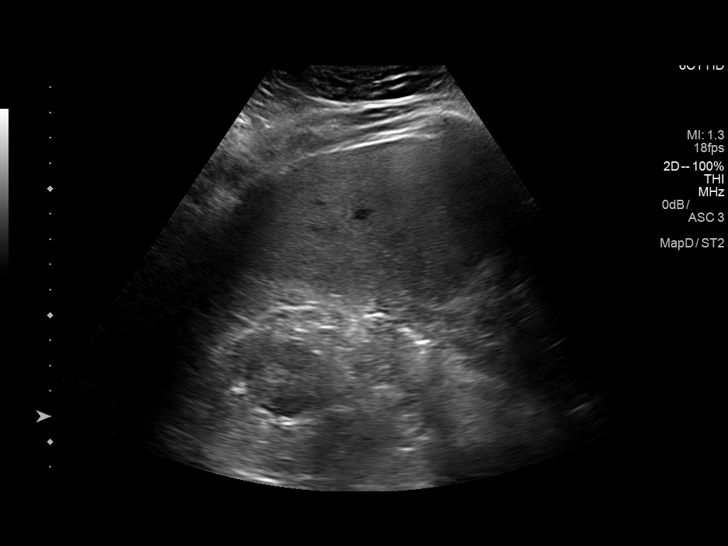
[im 15/59]
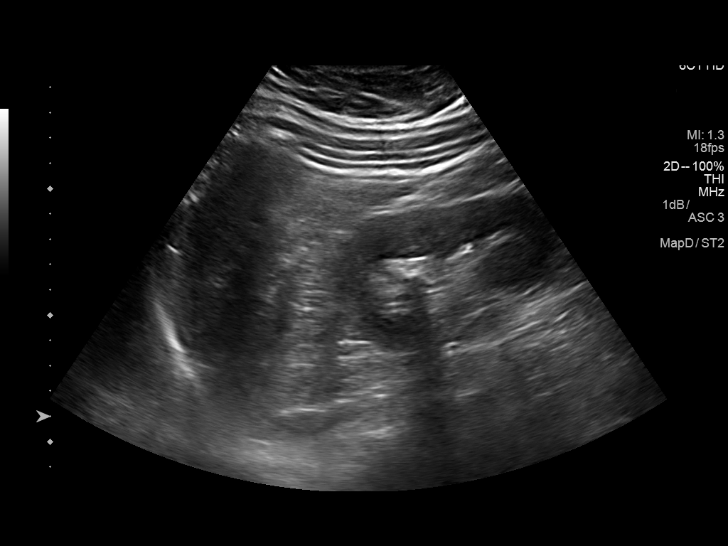
[im 20/59]
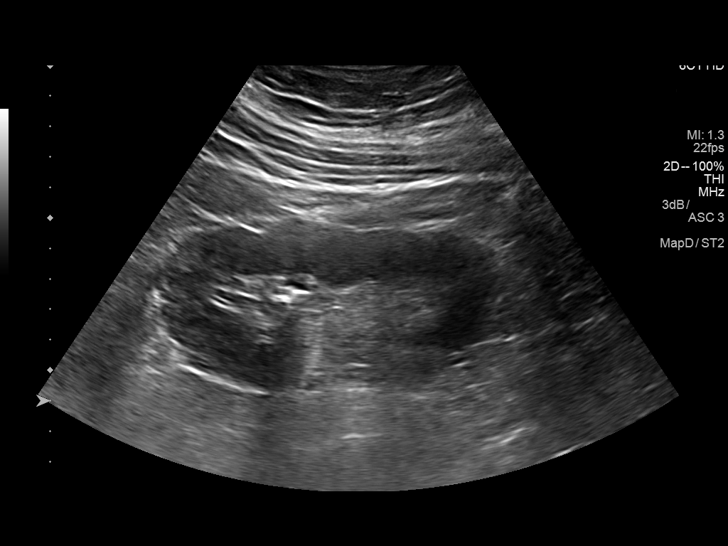
[im 22/59]
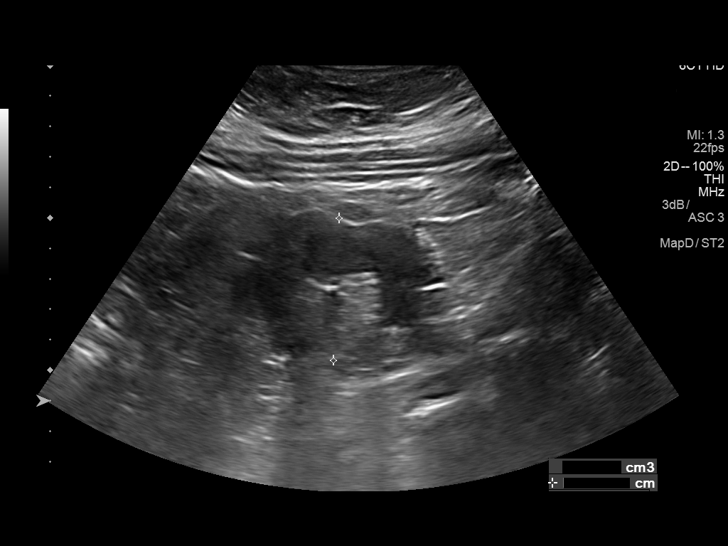
[im 27/59]
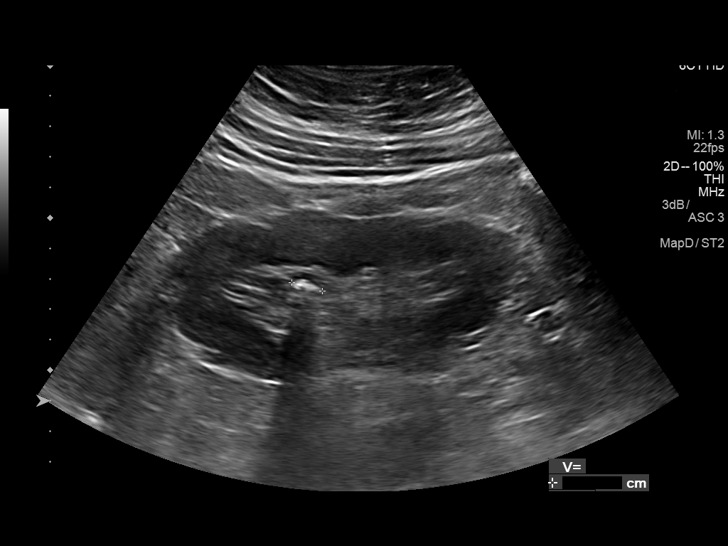
[im 32/59]
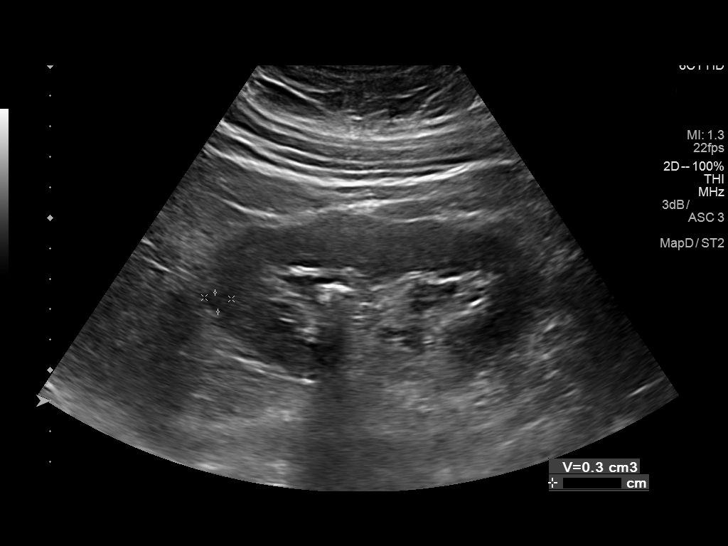
[im 37/59]
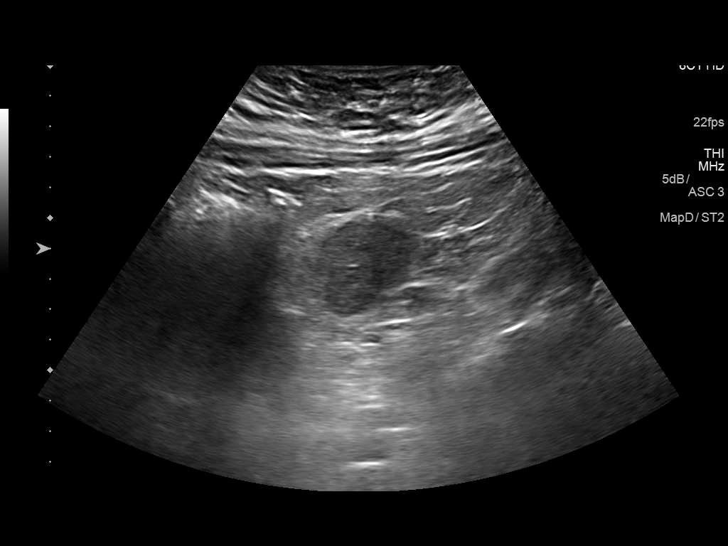
[im 39/59]
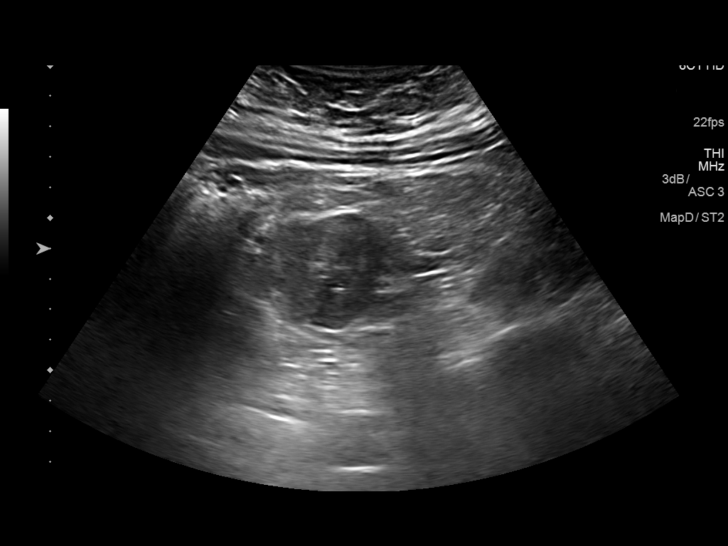
[im 44/59]
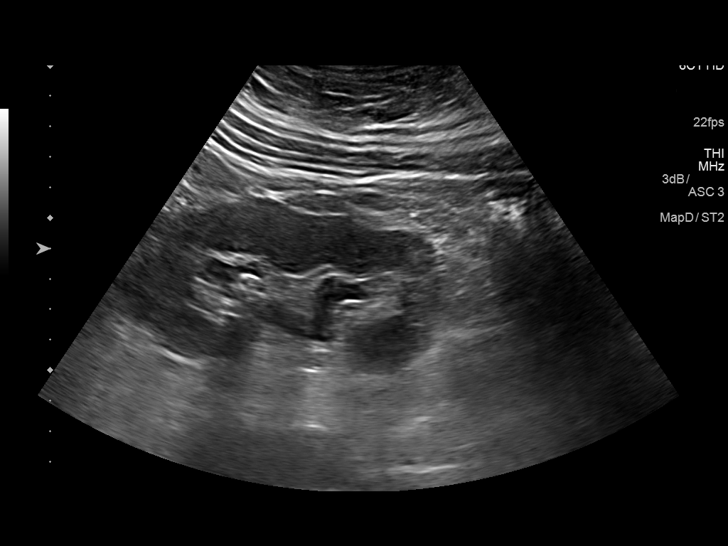
[im 49/59]
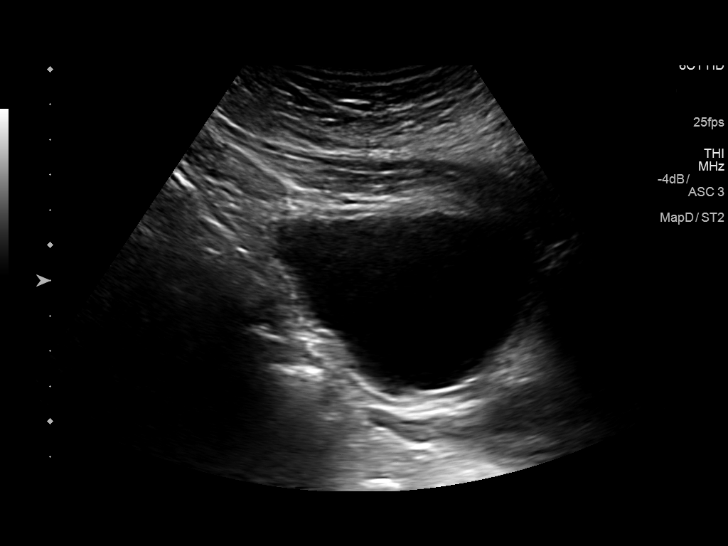
[im 54/59]
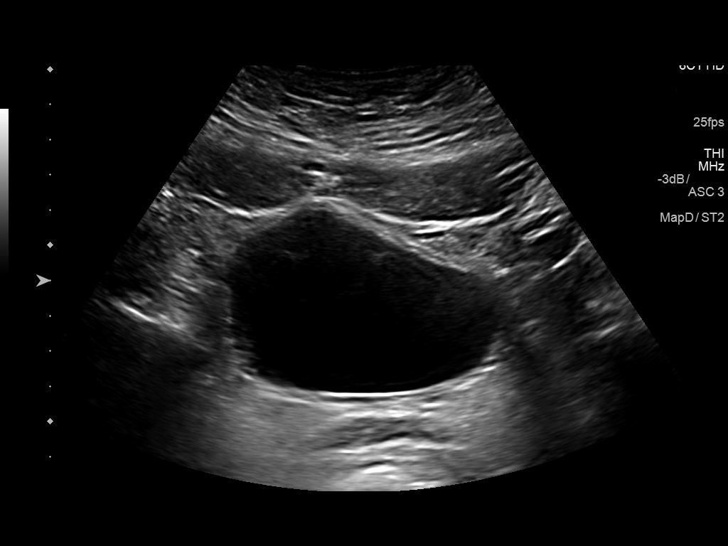
[im 59/59]
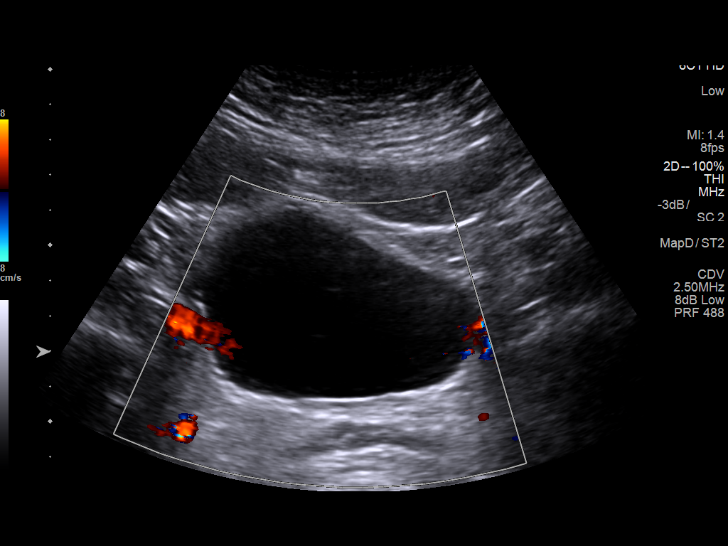

[14 of 25 positions shown; findings below may reference images not displayed]

FINDINGS: Right Kidney:

Renal measurements: 9.2 x 5.1 x 5.5 cm = volume: 135.3 mL .
Echogenicity and renal cortical thickness are within normal limits.
No mass, perinephric fluid, or hydronephrosis visualized. There is a
calculus in the upper pole right kidney measuring 1.0 cm in length.
No ureterectasis evident.

Left Kidney:

Renal measurements: 11.4 x 5.4 x 4.7 cm = volume: 150.3 mL.
Echogenicity and renal cortical thickness are within normal limits.
No perinephric fluid or hydronephrosis visualized. There is a cyst
in the upper pole the right kidney measuring 1.0 x 0.7 x 0.7 cm. A
second upper pole cyst on the left measures 1.0 x 0.9 x 0.6 cm.
There is a calculus in the mid left kidney measuring approximately 1
cm in length.

Bladder:

Appears normal for degree of bladder distention.
IMPRESSION: Nonobstructing calculi in each kidney.  Small cysts in left kidney.

Kidney demonstrates normal echogenicity and cortical thickness. No
obstructing focus in either kidney.

## 2021-01-11 ENCOUNTER — Telehealth (INDEPENDENT_AMBULATORY_CARE_PROVIDER_SITE_OTHER): Payer: 59 | Admitting: Cardiovascular Disease

## 2021-01-11 ENCOUNTER — Encounter: Payer: Self-pay | Admitting: Cardiovascular Disease

## 2021-01-11 VITALS — Ht 69.5 in | Wt 263.0 lb

## 2021-01-11 DIAGNOSIS — Z8669 Personal history of other diseases of the nervous system and sense organs: Secondary | ICD-10-CM

## 2021-01-11 DIAGNOSIS — Z794 Long term (current) use of insulin: Secondary | ICD-10-CM

## 2021-01-11 DIAGNOSIS — I428 Other cardiomyopathies: Secondary | ICD-10-CM | POA: Diagnosis not present

## 2021-01-11 DIAGNOSIS — E0800 Diabetes mellitus due to underlying condition with hyperosmolarity without nonketotic hyperglycemic-hyperosmolar coma (NKHHC): Secondary | ICD-10-CM

## 2021-01-11 DIAGNOSIS — I5042 Chronic combined systolic (congestive) and diastolic (congestive) heart failure: Secondary | ICD-10-CM

## 2021-01-11 DIAGNOSIS — I10 Essential (primary) hypertension: Secondary | ICD-10-CM

## 2021-01-11 DIAGNOSIS — E7800 Pure hypercholesterolemia, unspecified: Secondary | ICD-10-CM

## 2021-01-11 NOTE — Progress Notes (Signed)
Virtual Visit via Video Note   This visit type was conducted due to national recommendations for restrictions regarding the COVID-19 Pandemic (e.g. social distancing) in an effort to limit this patient's exposure and mitigate transmission in our community.  Due to his co-morbid illnesses, this patient is at least at moderate risk for complications without adequate follow up.  This format is felt to be most appropriate for this patient at this time.  All issues noted in this document were discussed and addressed.  A limited physical exam was performed with this format.  Please refer to the patient's chart for his consent to telehealth for [facility].     The patient was identified using 2 identifiers.  Date:  01/11/2021   ID:  [name], DOB 1961/02/26, MRN [id number]  Patient Location: Home Provider Location: Office/Clinic  PCP:  Ralene Ok, MD  Cardiologist:  Chilton Si, MD  Electrophysiologist:  None   Evaluation Performed:  Follow-Up Visit  Chief Complaint:  f/u  History of Present Illness:     The patient does not have symptoms concerning for COVID-19 infection (fever, chills, cough, or new shortness of breath).   History of Present Illness: JONEL WELDON is a 60 y.o. male with chronic systolic and diastolic heart failure (resolved), hypertension, hyperlipidemia, CKD 3B, OSA, and diabetes who presents for follow up.  He was initially seen in 2017 for management of chronic systolic heart failure.  Mr. Zaccone reports that he was diagnosed with heart failure in the early 2000s. He reports undergoing cardiac catheterization that did not reveal any significant blockages.  He followed up with a cardiologist for short period of time but had not been seen in several years.  He had an echo 12/16 that revealed LVEF 40-45% and hypokinesis of the mid-apical anterolateral myocardium.  There was grade 1 diastolic dysfunction.  Overall Mr. Goldman has been doing well.   He has noted some LE edema after eating certain foods.  Mr. Kuch has a CPAP but uses it off/on.  He was admitted 10/2017 with acute on chronic heart failure.  Echo at that time revealed a reduction in his LVEF to 15 to 20%.  He was diuresed and started on Entresto.  Left heart catheterization revealed no coronary artery disease.  He followed up with Theodore Demark, PA, and was referred for repeat echo 03/12/18 that showed some improvement in his LVEF to 30%.  He was started on Entresto, carvedilol and hydralazine.  Repeat echo 05/2018 showed an improvement in his EF to 50 to 55%.  Since his last appointment he had a lithotripsy that went well.  He participated in PREP exercise program through the [facility].  He enjoyed the program and felt well with exercise.  He had no exertional chest pain or shortness of breath.  He denies any lower extremity edema, orthopnea, or PND.  He does not check his blood pressure regularly.  When he does check it it typically is well-controlled.  His only complaint today is GERD and gassiness.  He plans to see his PCP about this.  Overall he is doing well.   Past Medical History:  Diagnosis Date  . CHF (congestive heart failure) (HCC)   . Childhood asthma    no longer an issue   . Chronic combined systolic and diastolic heart failure (HCC) 10/12/2015  . Diabetic peripheral neuropathy (HCC)   . Essential hypertension 10/12/2015  . GERD (gastroesophageal reflux disease)    no longer an issue   .  Gout    "on daily RX" (11/09/2017)  . History of kidney stones   . Hyperlipidemia 10/12/2015  . Morbid obesity (HCC) 10/12/2015  . OSA on CPAP   . Type II diabetes mellitus (HCC)     Past Surgical History:  Procedure Laterality Date  . CARDIAC CATHETERIZATION  2003   nl cors, Dr Elsie Lincoln  . CYSTOSCOPY/RETROGRADE/URETEROSCOPY/STONE EXTRACTION WITH BASKET N/A 10/02/2020   Procedure: CYSTOSCOPY/BILATERAL RETROGRADE/BILATERAL URETEROSCOPY/LEFT STONE EXTRACTION WITH BASKET/LEFT  HOLMIUM LASER LITHOTRIPSY/BILATERAL STENT PLACEMENT;  Surgeon: Jannifer Hick, MD;  Location: [facility];  Service: Urology;  Laterality: N/A;  . RIGHT/LEFT HEART CATH AND CORONARY ANGIOGRAPHY N/A 11/13/2017   Procedure: RIGHT/LEFT HEART CATH AND CORONARY ANGIOGRAPHY;  Surgeon: [name], MD;  Location: [facility];  Service: Cardiovascular;  Laterality: N/A;  . TONSILLECTOMY       Current Outpatient Medications  Medication Sig Dispense Refill  . allopurinol (ZYLOPRIM) 300 MG tablet Take 300 mg by mouth daily.    . Ascorbic Acid (VITAMIN C PO) Take 1 tablet by mouth daily as needed (immune support).    Marland Kitchen aspirin EC 81 MG tablet Take 81 mg by mouth daily.    . carvedilol (COREG) 25 MG tablet TAKE 1 TABLET BY MOUTH TWICE A DAY (Patient taking differently: Take 25 mg by mouth 2 (two) times daily with a meal.) 180 tablet 3  . furosemide (LASIX) 40 MG tablet Take 40 mg by mouth daily.    Marland Kitchen gabapentin (NEURONTIN) 300 MG capsule Take 300 mg by mouth at bedtime.  1  . hydrALAZINE (APRESOLINE) 50 MG tablet TAKE 1 TABLET BY MOUTH THREE TIMES A DAY (Patient taking differently: Take 50 mg by mouth 3 (three) times daily.) 90 tablet 5  . Insulin Degludec (TRESIBA ) Inject into the skin. 70 units in the am daily    . lovastatin (MEVACOR) 40 MG tablet Take 40 mg by mouth at bedtime.    . metFORMIN (GLUCOPHAGE) 500 MG tablet Take 500 mg by mouth 2 (two) times daily with a meal.    . oxybutynin (DITROPAN-XL) 5 MG 24 hr tablet Take 1 tablet (5 mg total) by mouth at bedtime. 30 tablet 1  . OZEMPIC 1 MG/DOSE SOPN Inject 1 mg as directed every Friday.     . potassium chloride SA (K-DUR,KLOR-CON) 20 MEQ tablet Take 20 mEq by mouth daily.     . sacubitril-valsartan (ENTRESTO) 24-26 MG Take 1 tablet by mouth 2 (two) times daily. 60 tablet 1  . oxyCODONE-acetaminophen (PERCOCET) 5-325 MG tablet Take 1 tablet by mouth every 4 (four) hours as needed for up to 12 doses for severe pain. (Patient not taking:  Reported on 01/11/2021) 12 tablet 0  . tamsulosin (FLOMAX) 0.4 MG CAPS capsule Take 0.4 mg by mouth at bedtime. (Patient not taking: Reported on 01/11/2021)     No current facility-administered medications for this visit.    Allergies:   Patient has no known allergies.    Social History:  The patient  reports that he has never smoked. He has never used smokeless tobacco. He reports previous drug use. He reports that he does not drink alcohol.   Family History:  The patient's family history includes Cancer in his sister; Diabetes in his father; Heart attack in his father, mother, and paternal grandmother; Heart disease in his father; Hypertension in his father.    ROS:  Please see the history of present illness.   Otherwise, review of systems are positive for none.  All other systems are reviewed and negative.    PHYSICAL EXAM: Ht 5' 9.5" (1.765 m)   Wt 263 lb (119.3 kg)   BMI 38.28 kg/m  GENERAL: Well-appearing.  No acute distress. HEENT: Pupils equal round.  Oral mucosa unremarkable NECK:  No jugular venous distention, no visible thyromegaly EXT:  No edema, no cyanosis no clubbing SKIN:  No rashes no nodules NEURO:  Speech fluent.  Cranial nerves grossly intact.  Moves all 4 extremities freely PSYCH:  Cognitively intact, oriented to person place and time   EKG:  EKG is not ordered today. The ekg ordered 10/12/15 demonstrates Sinus tachycardia rate 105 bpm.  low voltage in the limb and precordial leads. 06/21/2018: Sinus rhythm.  99 bpm. 08/13/2020: Sinus rhythm.  Rate 93 bpm.  Echo 06/11/18: Study Conclusions  - Left ventricle: The cavity size was normal. Wall thickness was   increased in a pattern of mild LVH. Systolic function was normal.   The estimated ejection fraction was in the range of 50% to 55%.   Wall motion was normal; there were no regional wall motion   abnormalities. Doppler parameters are consistent with abnormal   left ventricular relaxation (grade 1  diastolic dysfunction).  Impressions:  - Definity used; low normal to mildly reduced LV systolic function   (EF 50); mild LVH; mild diastolic dysfunction.  Echo 03/12/18: Study Conclusions  - Left ventricle: Apical window is foreshoertened some,   particularly in 2 chamber views. LVEF is approximately 30%with   diffuse hypokinesis. In comparison to images from December 2018   there is no significant change. The cavity size was mildly   dilated. Wall thickness was increased in a pattern of mild LVH.  Echo 10/23/15: Study Conclusions  - Left ventricle: The cavity size was normal. Wall thickness was  increased in a pattern of mild LVH. Systolic function was mildly  to moderately reduced. The estimated ejection fraction was in the  range of 40% to 45%. There is hypokinesis of the  mid-apicalanterolateral myocardium. Doppler parameters are  consistent with abnormal left ventricular relaxation (grade 1  diastolic dysfunction).  LHC/RHC 11/13/17: 1. Large, tortuous coronary arteries without angiographically significant coronary artery disease. 2. Normal left and right heart filling pressures. 3. Normal pulmonary artery pressure. 4. Low normal Fick cardiac index.  RA 6, RV 32/9, PA 26/15, mean PA 19, PCWP 12 .   Recent Labs: 09/22/2020: BUN 38; Creatinine, Ser 1.88; Hemoglobin 11.3; Platelets 222; Potassium 4.6; Sodium 139   05/29/2018: WBC 6.6, hemoglobin 11.8, hematocrit 38.3, platelets 200 Sodium 140, potassium 4.6, BUN 45, creatinine 1.84 AST 19, ALT 19 Hemoglobin A1c 5.0% Total cholesterol 122, triglycerides 201, HDL 33, LDL 49  Lipid Panel    Component Value Date/Time   CHOL 125 04/10/2018 1040   TRIG 139 04/10/2018 1040   HDL 36 (L) 04/10/2018 1040   CHOLHDL 3.5 04/10/2018 1040   CHOLHDL 5.0 12/18/2010 0425   VLDL 70 (H) 12/18/2010 0425   LDLCALC 61 04/10/2018 1040   LDLDIRECT 70 12/24/2019 1043      Wt Readings from Last 3 Encounters:  01/11/21 263 lb  (119.3 kg)  12/10/20 264 lb 12.8 oz (120.1 kg)  11/25/20 261 lb (118.4 kg)     ASSESSMENT AND PLAN:  # Chronic diastolic heart failure: # Hypertension:  Mr. Fisch LVEF was reduced to 15% and has now recovered to 50-55%.  He has not had an echocardiogram in 3 years.  We will repeat an echo to assess his systolic  function.  Continue carvedilol, Lasix, hydralazine, and Entresto.   # Hyperlipidemia: Continue lovastatin.  Lipids are well have been controlled.  He is due for follow-up with his PCP and will have fasting lipids at that time.  # Morbid obesity: Mr. Boike was encouraged to start back exercising.  He did well with the prep exercise program through the [facility] and wants to get back into exercise.  # OSA: Continue CPAP.  # DM:  # CKD 3B:   Given his heart failure and CKD, he would be a good candidate for an SGLT2 inhibitor such as [place] or Jardiance.  Recommended that he talk with his PCP about this given that he is on multiple agents we will not add it at this time.  COVID-19 Education: The signs and symptoms of COVID-19 were discussed with the patient and how to seek care for testing (follow up with PCP or arrange E-visit).  The importance of social distancing was discussed today.  Time:   Today, I have spent 25 minutes with the patient with telehealth technology discussing the above problems.     Current medicines are reviewed at length with the patient today.  The patient does not have concerns regarding medicines.  The following changes have been made:  no change  Labs/ tests ordered today include:   No orders of the defined types were placed in this encounter.    Disposition:   FU with Neira Bentsen C. Duke Salvia, MD in 1 year.     Signed, Chilton Si, MD  01/11/2021 8:23 AM    [facility]

## 2021-01-11 NOTE — Patient Instructions (Signed)
Medication Instructions:  No Changes In Medications at this time.  *If you need a refill on your cardiac medications before your next appointment, please call your pharmacy*  Testing/Procedures: Your physician has requested that you have an echocardiogram. Echocardiography is a painless test that uses sound waves to create images of your heart. It provides your doctor with information about the size and shape of your heart and how well your heart's chambers and valves are working. You may receive an ultrasound enhancing agent through an IV if needed to better visualize your heart during the echo.This procedure takes approximately one hour. There are no restrictions for this procedure. This will take place at the [address], [address].   Follow-Up: At [facility], you and your health needs are our priority.  As part of our continuing mission to provide you with exceptional heart care, we have created designated Provider Care Teams.  These Care Teams include your primary Cardiologist (physician) and Advanced Practice Providers (APPs -  Physician Assistants and Nurse Practitioners) who all work together to provide you with the care you need, when you need it.  We recommend signing up for the patient portal called "MyChart".  Sign up information is provided on this After Visit Summary.  MyChart is used to connect with patients for Virtual Visits (Telemedicine).  Patients are able to view lab/test results, encounter notes, upcoming appointments, etc.  Non-urgent messages can be sent to your provider as well.   To learn more about what you can do with MyChart, go to [email or web address].    Your next appointment:   1 year(s)  The format for your next appointment:   In Person  Provider:   Chilton Si, MD

## 2021-01-25 ENCOUNTER — Encounter (HOSPITAL_COMMUNITY): Payer: Self-pay | Admitting: Cardiovascular Disease

## 2021-01-25 ENCOUNTER — Other Ambulatory Visit (HOSPITAL_COMMUNITY): Payer: 59

## 2021-02-08 ENCOUNTER — Telehealth (HOSPITAL_COMMUNITY): Payer: Self-pay | Admitting: Cardiovascular Disease

## 2021-02-08 NOTE — Telephone Encounter (Signed)
Just an FYI. We have made several attempts to contact this patient including sending a letter to schedule or reschedule their echocardiogram. We will be removing the patient from the echo WQ.  01/25/21 NO SHOWED- MAILED LETTER [facility]      Thank you 

## 2022-01-05 NOTE — Progress Notes (Signed)
Cardiology Office Note   Date:  01/06/2022   ID:  Miguel Ho, DOB [birth year], MRN [id number]  PCP:  Miguel Panda, MD  Cardiologist:  Miguel Latch, MD  Electrophysiologist:  None   Evaluation Performed:  Follow-Up Visit  Chief Complaint:  f/u  History of Present Illness:    Miguel Ho is a 61 y.o. male with chronic systolic and diastolic heart failure (resolved), hypertension, hyperlipidemia, CKD 3B, OSA, and diabetes who presents for follow up.  He was initially seen in 2017 for management of chronic systolic heart failure.  [name] reports that he was diagnosed with heart failure in the early 2000s. He reports undergoing cardiac catheterization that did not reveal any significant blockages.  He followed up with a cardiologist for short period of time but had not been seen in several years.  He had an echo 12/16 that revealed LVEF 40-45% and hypokinesis of the mid-apical anterolateral myocardium.  There was grade 1 diastolic dysfunction. He was admitted 10/2017 with acute on chronic heart failure.  Echo at that time revealed a reduction in his LVEF to 15 to 20%.  He was diuresed and started on Entresto.  Left heart catheterization revealed no coronary artery disease.  He followed up with Miguel Ferries, PA, and was referred for repeat echo 03/12/18 that showed some improvement in his LVEF to 30%.  He was started on Entresto, carvedilol and hydralazine.  Repeat echo 05/2018 showed an improvement in his EF to 50 to 55%.  At his last appointment he was doing well aside from complaints due to GERD, which he planned to discuss with his PCP. Today, he is feeling well and denies any recent complications. He does not usually check his blood pressure at home. In clinic today his blood pressure is 106/80. He denies any dizziness or lightheadedness. Recently he returned from a trip to [place]. He did a lot of walking with no significant issues. He noticed his weight increased a bit  which he attributes to the food. At work he is usually sitting at a desk. He plans to work on increasing his formal exercise. His walking during his trip was more exercise than usual for him. Of note, he was off of Ozempic for a couple months during the shortage. Currently he is back on Ozempic. He denies any palpitations, chest pain, shortness of breath, or peripheral edema. No headaches, syncope, orthopnea, or PND. He is fasting this morning.   Past Medical History:  Diagnosis Date   CHF (congestive heart failure) ([place])    Childhood asthma    no longer an issue    Chronic combined systolic and diastolic heart failure ([place]) 10/12/2015   Diabetic peripheral neuropathy ([place])    Essential hypertension 10/12/2015   GERD (gastroesophageal reflux disease)    no longer an issue    Gout    "on daily RX" (11/09/2017)   History of kidney stones    Hyperlipidemia 10/12/2015   Morbid obesity ([place]) 10/12/2015   OSA on CPAP    Type II diabetes mellitus (Miguel Ho)     Past Surgical History:  Procedure Laterality Date   CARDIAC CATHETERIZATION  2003   nl cors, Dr Miguel Ho   CYSTOSCOPY/RETROGRADE/URETEROSCOPY/STONE EXTRACTION WITH BASKET N/A 10/02/2020   Procedure: CYSTOSCOPY/BILATERAL RETROGRADE/BILATERAL URETEROSCOPY/LEFT STONE EXTRACTION WITH BASKET/LEFT HOLMIUM LASER LITHOTRIPSY/BILATERAL STENT PLACEMENT;  Surgeon: Miguel Lima, MD;  Location: [facility];  Service: Urology;  Laterality: N/A;   RIGHT/LEFT HEART CATH AND CORONARY ANGIOGRAPHY N/A 11/13/2017  Procedure: RIGHT/LEFT HEART CATH AND CORONARY ANGIOGRAPHY;  Surgeon: Miguel Bush, MD;  Location: [facility];  Service: Cardiovascular;  Laterality: N/A;   TONSILLECTOMY       Current Outpatient Medications  Medication Sig Dispense Refill   allopurinol (ZYLOPRIM) 300 MG tablet Take 300 mg by mouth daily.     Ascorbic Acid (VITAMIN C PO) Take 1 tablet by mouth daily as needed (immune support).     aspirin EC 81 MG tablet Take 81 mg by  mouth daily.     carvedilol (COREG) 25 MG tablet TAKE 1 TABLET BY MOUTH TWICE A DAY (Patient taking differently: Take 25 mg by mouth 2 (two) times daily with a meal.) 180 tablet 3   furosemide (LASIX) 40 MG tablet Take 40 mg by mouth daily.     gabapentin (NEURONTIN) 300 MG capsule Take 300 mg by mouth at bedtime.  1   hydrALAZINE (APRESOLINE) 50 MG tablet TAKE 1 TABLET BY MOUTH THREE TIMES A DAY (Patient taking differently: Take 50 mg by mouth 3 (three) times daily.) 90 tablet 5   Insulin Degludec (TRESIBA [place]) Inject into the skin. 70 units in the am daily     lovastatin (MEVACOR) 40 MG tablet Take 40 mg by mouth at bedtime.     metFORMIN (GLUCOPHAGE) 500 MG tablet Take 500 mg by mouth 2 (two) times daily with a meal.     omeprazole (PRILOSEC) 40 MG capsule Take 40 mg by mouth daily.     potassium chloride SA (K-DUR,KLOR-CON) 20 MEQ tablet Take 20 mEq by mouth daily.      sacubitril-valsartan (ENTRESTO) 24-26 MG Take 1 tablet by mouth 2 (two) times daily. 60 tablet 1   [START ON 01/07/2022] OZEMPIC, 1 MG/DOSE, 2 MG/1.5ML SOPN Inject 1 mg as directed every Friday. 12 mL 1   No current facility-administered medications for this visit.    Allergies:   Patient has no known allergies.    Social History:  The patient  reports that he has never smoked. He has never used smokeless tobacco. He reports that he does not currently use drugs. He reports that he does not drink alcohol.   Family History:  The patient's family history includes Cancer in his sister; Diabetes in his father; Heart attack in his father, mother, and paternal grandmother; Heart disease in his father; Hypertension in his father.    ROS:   Please see the history of present illness. All other systems are reviewed and negative.    PHYSICAL EXAM:  VS:  BP 106/80 (BP Location: Right Arm, Patient Position: Sitting, Cuff Size: Large)    Pulse 96    Ht 5' 9.5" (1.765 m)    Wt 275 lb 8 oz (125 kg)    BMI 40.10 kg/m  , BMI Body mass  index is 40.1 kg/m. GENERAL:  Well appearing HEENT: Pupils equal round and reactive, fundi not visualized, oral mucosa unremarkable NECK:  No jugular venous distention, waveform within normal limits, carotid upstroke brisk  LUNGS:  Clear to auscultation bilaterally HEART:  RRR.  PMI not displaced or sustained,S1 and S2 within normal limits, no S3, no S4, no clicks, no rubs, no murmurs ABD:  Flat, positive bowel sounds normal in frequency in pitch, no bruits, no rebound, no guarding, no midline pulsatile mass, no hepatomegaly, no splenomegaly EXT:  2 plus pulses throughout, no edema, no cyanosis no clubbing SKIN:  No rashes no nodules NEURO:  Cranial nerves II through XII grossly intact, motor grossly  intact throughout [facility]:  Cognitively intact, oriented to person place and time   EKG:   01/06/2022: Sinus rhythm. Rate 96 bpm. 08/13/2020: Sinus rhythm.  Rate 93 bpm. 06/21/2018: Sinus rhythm.  99 bpm. 10/12/15: Sinus tachycardia rate 105 bpm.  low voltage in the limb and precordial leads.   Echo 06/11/18: Study Conclusions   - Left ventricle: The cavity size was normal. Wall thickness was   increased in a pattern of mild LVH. Systolic function was normal.   The estimated ejection fraction was in the range of 50% to 55%.   Wall motion was normal; there were no regional wall motion   abnormalities. Doppler parameters are consistent with abnormal   left ventricular relaxation (grade 1 diastolic dysfunction).   Impressions:   - Definity used; low normal to mildly reduced LV systolic function   (EF 50); mild LVH; mild diastolic dysfunction.  Echo 03/12/18: Study Conclusions   - Left ventricle: Apical window is foreshoertened some,   particularly in 2 chamber views. LVEF is approximately 30%with   diffuse hypokinesis. In comparison to images from December 2018   there is no significant change. The cavity size was mildly   dilated. Wall thickness was increased in a pattern of mild  LVH.  LHC/RHC 11/13/17: Large, tortuous coronary arteries without angiographically significant coronary artery disease. Normal left and right heart filling pressures. Normal pulmonary artery pressure. Low normal Fick cardiac index.  RA 6, RV 32/9, PA 26/15, mean PA 19, PCWP 12  Echo 10/23/15: Study Conclusions   - Left ventricle: The cavity size was normal. Wall thickness was   increased in a pattern of mild LVH. Systolic function was mildly   to moderately reduced. The estimated ejection fraction was in the   range of 40% to 45%. There is hypokinesis of the   mid-apicalanterolateral myocardium. Doppler parameters are   consistent with abnormal left ventricular relaxation (grade 1   diastolic dysfunction).    Recent Labs: No results found for requested labs within last 8760 hours.   05/29/2018: WBC 6.6, hemoglobin 11.8, hematocrit 38.3, platelets 200 Sodium 140, potassium 4.6, BUN 45, creatinine 1.84 AST 19, ALT 19 Hemoglobin A1c 5.0% Total cholesterol 122, triglycerides 201, HDL 33, LDL 49  Lipid Panel    Component Value Date/Time   CHOL 125 04/10/2018 1040   TRIG 139 04/10/2018 1040   HDL 36 (L) 04/10/2018 1040   CHOLHDL 3.5 04/10/2018 1040   CHOLHDL 5.0 12/18/2010 0425   VLDL 70 (H) 12/18/2010 0425   LDLCALC 61 04/10/2018 1040   LDLDIRECT 70 12/24/2019 1043     Wt Readings from Last 3 Encounters:  01/06/22 275 lb 8 oz (125 kg)  01/11/21 263 lb (119.3 kg)  12/10/20 264 lb 12.8 oz (120.1 kg)     ASSESSMENT AND PLAN:  Essential hypertension Blood pressure is well-controlled.  Continue carvedilol and Entresto.  He will work on increasing his exercise to at least 150 minutes weekly.  Chronic combined systolic and diastolic heart failure (HCC) LVEF has recovered.  His last echo was in 2019.  Repeat echo.  Continue carvedilol and Entresto.   Hyperlipidemia Continue lovastatin.  Check lipids and CMP.  Morbid obesity ([place]) Increase exercise to 150 minutes  weekly.  Refill Ozempic.     Current medicines are reviewed at length with the patient today.  The patient does not have concerns regarding medicines.  The following changes have been made:  no change  Labs/ tests ordered today include:   Orders Placed  This Encounter  Procedures   Lipid panel   Comprehensive metabolic panel   EKG [id number]   ECHOCARDIOGRAM COMPLETE    Disposition:   FU with Yoshimi Sarr C. Oval Linsey, MD in 1 year.    I,Mathew Stumpf,acting as a Education administrator for Miguel Latch, MD.,have documented all relevant documentation on the behalf of Miguel Latch, MD,as directed by  Miguel Latch, MD while in the presence of Miguel Latch, MD.  I, [place] Oval Linsey, MD have reviewed all documentation for this visit.  The documentation of the exam, diagnosis, procedures, and orders on 01/06/2022 are all accurate and complete.   Signed, Miguel Latch, MD  01/06/2022 10:40 AM    [place]

## 2022-01-06 ENCOUNTER — Encounter (HOSPITAL_BASED_OUTPATIENT_CLINIC_OR_DEPARTMENT_OTHER): Payer: Self-pay | Admitting: Cardiovascular Disease

## 2022-01-06 ENCOUNTER — Ambulatory Visit (INDEPENDENT_AMBULATORY_CARE_PROVIDER_SITE_OTHER): Payer: PRIVATE HEALTH INSURANCE | Admitting: Cardiovascular Disease

## 2022-01-06 ENCOUNTER — Other Ambulatory Visit: Payer: Self-pay

## 2022-01-06 DIAGNOSIS — I5042 Chronic combined systolic (congestive) and diastolic (congestive) heart failure: Secondary | ICD-10-CM | POA: Diagnosis not present

## 2022-01-06 DIAGNOSIS — I10 Essential (primary) hypertension: Secondary | ICD-10-CM

## 2022-01-06 DIAGNOSIS — E7800 Pure hypercholesterolemia, unspecified: Secondary | ICD-10-CM | POA: Diagnosis not present

## 2022-01-06 LAB — COMPREHENSIVE METABOLIC PANEL
ALT: 21 IU/L (ref 0–44)
AST: 28 IU/L (ref 0–40)
Albumin/Globulin Ratio: 1.4 (ref 1.2–2.2)
Albumin: 4.2 g/dL (ref 3.8–4.8)
Alkaline Phosphatase: 91 IU/L (ref 44–121)
BUN/Creatinine Ratio: 18 (ref 10–24)
BUN: 38 mg/dL — ABNORMAL HIGH (ref 8–27)
Bilirubin Total: 0.2 mg/dL (ref 0.0–1.2)
CO2: 25 mmol/L (ref 20–29)
Calcium: 9.9 mg/dL (ref 8.6–10.2)
Chloride: 105 mmol/L (ref 96–106)
Creatinine, Ser: 2.11 mg/dL — ABNORMAL HIGH (ref 0.76–1.27)
Globulin, Total: 2.9 g/dL (ref 1.5–4.5)
Glucose: 88 mg/dL (ref 70–99)
Potassium: 4.8 mmol/L (ref 3.5–5.2)
Sodium: 140 mmol/L (ref 134–144)
Total Protein: 7.1 g/dL (ref 6.0–8.5)
eGFR: 35 mL/min/{1.73_m2} — ABNORMAL LOW (ref >59)

## 2022-01-06 LAB — LIPID PANEL
Chol/HDL Ratio: 4.3 ratio (ref 0.0–5.0)
Cholesterol, Total: 172 mg/dL (ref 100–199)
HDL: 40 mg/dL (ref >39)
LDL Chol Calc (NIH): 104 mg/dL — ABNORMAL HIGH (ref 0–99)
Triglycerides: 161 mg/dL — ABNORMAL HIGH (ref 0–149)
VLDL Cholesterol Cal: 28 mg/dL (ref 5–40)

## 2022-01-06 MED ORDER — OZEMPIC (1 MG/DOSE) 2 MG/1.5ML ~~LOC~~ SOPN: 1.0000 mg | PEN_INJECTOR | SUBCUTANEOUS | 1 refills | Status: AC

## 2022-01-06 NOTE — Assessment & Plan Note (Signed)
LVEF has recovered.  His last echo was in 2019.  Repeat echo.  Continue carvedilol and Entresto.

## 2022-01-06 NOTE — Assessment & Plan Note (Signed)
Continue lovastatin.  Check lipids and CMP.

## 2022-01-06 NOTE — Assessment & Plan Note (Signed)
Blood pressure is well-controlled.  Continue carvedilol and Entresto.  He will work on increasing his exercise to at least 150 minutes weekly.

## 2022-01-06 NOTE — Assessment & Plan Note (Signed)
Increase exercise to 150 minutes weekly.  Refill Ozempic.

## 2022-01-06 NOTE — Patient Instructions (Signed)
Medication Instructions:  Your physician recommends that you continue on your current medications as directed. Please refer to the Current Medication list given to you today.   *If you need a refill on your cardiac medications before your next appointment, please call your pharmacy*   Lab Work: LP/CMET TODAY   If you have labs (blood work) drawn today and your tests are completely normal, you will receive your results only by: MyChart Message (if you have MyChart) OR A paper copy in the mail If you have any lab test that is abnormal or we need to change your treatment, we will call you to review the results.  Testing/Procedures: Your physician has requested that you have an echocardiogram. Echocardiography is a painless test that uses sound waves to create images of your heart. It provides your doctor with information about the size and shape of your heart and how well your hearts chambers and valves are working. This procedure takes approximately one hour. There are no restrictions for this procedure  Follow-Up: At [facility], you and your health needs are our priority.  As part of our continuing mission to provide you with exceptional heart care, we have created designated Provider Care Teams.  These Care Teams include your primary Cardiologist (physician) and Advanced Practice Providers (APPs -  Physician Assistants and Nurse Practitioners) who all work together to provide you with the care you need, when you need it.  We recommend signing up for the patient portal called "MyChart".  Sign up information is provided on this After Visit Summary.  MyChart is used to connect with patients for Virtual Visits (Telemedicine).  Patients are able to view lab/test results, encounter notes, upcoming appointments, etc.  Non-urgent messages can be sent to your provider as well.   To learn more about what you can do with MyChart, go to [email or web address].    Your next appointment:   12  month(s)  The format for your next appointment:   In Person  Provider:   Chilton Si, MD   GO TO [email or web address] TO SIGN UP FOR YOUR SAVINGS CARD

## 2022-01-18 ENCOUNTER — Ambulatory Visit (INDEPENDENT_AMBULATORY_CARE_PROVIDER_SITE_OTHER): Payer: PRIVATE HEALTH INSURANCE

## 2022-01-18 DIAGNOSIS — I5042 Chronic combined systolic (congestive) and diastolic (congestive) heart failure: Secondary | ICD-10-CM | POA: Diagnosis not present

## 2022-01-18 DIAGNOSIS — I10 Essential (primary) hypertension: Secondary | ICD-10-CM | POA: Diagnosis not present

## 2022-01-18 LAB — ECHOCARDIOGRAM COMPLETE
AR max vel: 3.43 cm2
AV Area VTI: 3.25 cm2
AV Area mean vel: 3.17 cm2
AV Mean grad: 2 mmHg
AV Peak grad: 3.2 mmHg
Ao pk vel: 0.89 m/s
S' Lateral: 2.56 cm

## 2022-01-21 ENCOUNTER — Telehealth: Payer: Self-pay | Admitting: *Deleted

## 2022-01-21 ENCOUNTER — Encounter: Payer: Self-pay | Admitting: *Deleted

## 2022-01-21 DIAGNOSIS — I10 Essential (primary) hypertension: Secondary | ICD-10-CM

## 2022-01-21 DIAGNOSIS — Z5181 Encounter for therapeutic drug level monitoring: Secondary | ICD-10-CM

## 2022-01-21 MED ORDER — FUROSEMIDE 20 MG PO TABS: 20.0000 mg | ORAL_TABLET | Freq: Every day | ORAL | 3 refills | Status: DC

## 2022-01-21 NOTE — Telephone Encounter (Signed)
Advised patient of lab results  ?Mailed lab orders and sent new Rx to [facility] ?

## 2022-01-21 NOTE — Telephone Encounter (Signed)
This encounter was created in error - please disregard.

## 2022-01-21 NOTE — Telephone Encounter (Signed)
-----   Message from Chilton Si, MD sent at 01/20/2022  2:48 PM EST ----- ?Liver function is normal.  Kidney function remains abnormal and is slightly worse than last year.  Recommend reducing Lasix to 20 mg a day.  Check BMP in a couple weeks. ?

## 2022-11-24 ENCOUNTER — Encounter (HOSPITAL_BASED_OUTPATIENT_CLINIC_OR_DEPARTMENT_OTHER): Payer: Self-pay

## 2022-12-23 ENCOUNTER — Other Ambulatory Visit (HOSPITAL_BASED_OUTPATIENT_CLINIC_OR_DEPARTMENT_OTHER): Payer: Self-pay

## 2022-12-23 NOTE — Telephone Encounter (Signed)
Received fax from [facility] requesting refills for Furosemide 20 mg. Pt last seen 12/2021 by Dr. Oval Linsey. Pt will need appt for refills.

## 2022-12-26 MED ORDER — FUROSEMIDE 20 MG PO TABS: 20.0000 mg | ORAL_TABLET | Freq: Every day | ORAL | 0 refills | Status: DC

## 2022-12-26 NOTE — Telephone Encounter (Signed)
Rx request sent to pharmacy.  

## 2022-12-26 NOTE — Telephone Encounter (Signed)
Scheduled to see [facility] 12/30/22

## 2022-12-30 ENCOUNTER — Other Ambulatory Visit (HOSPITAL_BASED_OUTPATIENT_CLINIC_OR_DEPARTMENT_OTHER): Payer: Self-pay | Admitting: Cardiovascular Disease

## 2022-12-30 ENCOUNTER — Encounter (HOSPITAL_BASED_OUTPATIENT_CLINIC_OR_DEPARTMENT_OTHER): Payer: Self-pay | Admitting: Family

## 2022-12-30 ENCOUNTER — Ambulatory Visit (HOSPITAL_BASED_OUTPATIENT_CLINIC_OR_DEPARTMENT_OTHER): Payer: BC Managed Care – PPO | Admitting: Family

## 2022-12-30 VITALS — BP 142/88 | HR 75 | Ht 69.5 in | Wt 279.0 lb

## 2022-12-30 DIAGNOSIS — Z6841 Body Mass Index (BMI) 40.0 and over, adult: Secondary | ICD-10-CM | POA: Diagnosis not present

## 2022-12-30 DIAGNOSIS — I5042 Chronic combined systolic (congestive) and diastolic (congestive) heart failure: Secondary | ICD-10-CM

## 2022-12-30 DIAGNOSIS — E782 Mixed hyperlipidemia: Secondary | ICD-10-CM | POA: Diagnosis not present

## 2022-12-30 DIAGNOSIS — I10 Essential (primary) hypertension: Secondary | ICD-10-CM

## 2022-12-30 DIAGNOSIS — Z794 Long term (current) use of insulin: Secondary | ICD-10-CM

## 2022-12-30 DIAGNOSIS — E118 Type 2 diabetes mellitus with unspecified complications: Secondary | ICD-10-CM

## 2022-12-30 NOTE — Patient Instructions (Signed)
Medication Instructions:  Continue your current medications.   Recommend talking to Dr. Mellody Drown about increasing Ozempic to 67m dose. You would likely have to reduce your dose of insulin.   *If you need a refill on your cardiac medications before your next appointment, please call your pharmacy*   Lab Work/Testing/Procedures: None ordered today.    Follow-Up: [facility] and your [facility] needs are our [facility].  As part of our continuing mission to provide you with exceptional heart care, we have created designated Provider Care Teams.  These Care Teams include your primary Cardiologist (physician) and Advanced Practice Providers (APPs -  Physician Assistants and Nurse Practitioners) who all work together to provide you with the care you need, when you need it.  We recommend signing up for the patient portal called "MyChart".  Sign up information is provided on this After Visit Summary.  MyChart is used to connect with patients for Virtual Visits (Telemedicine).  Patients are able to view lab/test results, encounter notes, upcoming appointments, etc.  Non-urgent messages can be sent to your provider as well.   To learn more about what you can do with MyChart, go to [email or web address]    Your next appointment:   1 year(s)  Provider:   TSkeet Latch MD or CLaurann Montana NP    Other Instructions  Tips to Measure your Blood Pressure Correctly  Check blood pressure once per day at least 2 hours after your medications.   We will call you in a week to check in on your blood pressure.   To determine whether you have hypertension, a medical professional will take a blood pressure reading. How you prepare for the test, the position of your arm, and other factors can change a blood pressure reading by 10% or more. That could be enough to hide high blood pressure, start you on a drug you don't really need, or lead your doctor to incorrectly adjust your  medications.  National and international guidelines offer specific instructions for measuring blood pressure. If a doctor, nurse, or medical assistant isn't doing it right, don't hesitate to ask him or her to get with the guidelines.  Here's what you can do to ensure a correct reading:  Don't drink a caffeinated beverage or smoke during the 30 minutes before the test.  Sit quietly for five minutes before the test begins.  During the measurement, sit in a chair with your feet on the floor and your arm supported so your elbow is at about heart level.  The inflatable part of the cuff should completely cover at least 80% of your upper arm, and the cuff should be placed on bare skin, not over a shirt.  Don't talk during the measurement.   Blood pressure categories  Blood pressure category SYSTOLIC (upper number)  DIASTOLIC (lower number)  Normal Less than 120 mm Hg and Less than 80 mm Hg  Elevated 120-129 mm Hg and Less than 80 mm Hg  High blood pressure: Stage 1 hypertension 130-139 mm Hg or 80-89 mm Hg  High blood pressure: Stage 2 hypertension 140 mm Hg or higher or 90 mm Hg or higher  Hypertensive crisis (consult your doctor immediately) Higher than 180 mm Hg and/or Higher than 120 mm Hg  Source: American Heart Association and American Stroke Association. For more on getting your blood pressure under control, buy Controlling Your Blood Pressure, a Special Health Report from [facility]   Blood Pressure Log   Date  Time  Blood Pressure  Example: Nov 1 9 AM 124/78

## 2022-12-30 NOTE — Progress Notes (Signed)
Office Visit    Patient Name: Miguel Ho Date of Encounter: 12/30/2022  PCP:  Jilda Panda, [place]  Cardiologist:  Skeet Latch, MD  Advanced Practice Provider:  No care team member to display Electrophysiologist:  None      Chief Complaint    Miguel Ho is a 62 y.o. male presents today for follow up of hypertension.    Past Medical History    Past Medical History:  Diagnosis Date   CHF (congestive heart failure) ([place])    Childhood asthma    no longer an issue    Chronic combined systolic and diastolic heart failure ([place]) 10/12/2015   Diabetic peripheral neuropathy ([place])    Essential hypertension 10/12/2015   GERD (gastroesophageal reflux disease)    no longer an issue    Gout    "on daily RX" (11/09/2017)   History of kidney stones    Hyperlipidemia 10/12/2015   Morbid obesity ([place]) 10/12/2015   OSA on CPAP    Type II diabetes mellitus ([place])    Past Surgical History:  Procedure Laterality Date   CARDIAC CATHETERIZATION  2003   nl cors, Dr Melvern Banker   CYSTOSCOPY/RETROGRADE/URETEROSCOPY/STONE EXTRACTION WITH BASKET N/A 10/02/2020   Procedure: CYSTOSCOPY/BILATERAL RETROGRADE/BILATERAL URETEROSCOPY/LEFT STONE EXTRACTION WITH BASKET/LEFT HOLMIUM LASER LITHOTRIPSY/BILATERAL STENT PLACEMENT;  Surgeon: Janith Lima, MD;  Location: [facility];  Service: Urology;  Laterality: N/A;   RIGHT/LEFT HEART CATH AND CORONARY ANGIOGRAPHY N/A 11/13/2017   Procedure: RIGHT/LEFT HEART CATH AND CORONARY ANGIOGRAPHY;  Surgeon: Nelva Bush, MD;  Location: [facility];  Service: Cardiovascular;  Laterality: N/A;   TONSILLECTOMY      Allergies  No Known Allergies  History of Present Illness    Miguel Ho is a 62 y.o. male with a hx of chronic systolic and diastolic heart failure with recovered LVEF, hypertension, hyperlipidemia, CKD 3B, OSA, diabetes last seen 01/06/2022 by Dr. Oval Linsey.  Initially seen in 2017 for  management of chronic systolic heart failure.  Diagnosed with heart failure in the early 2000's per his report.  Cardiac catheterization at the time did not reveal any significant blockages per his report.  Echo 10/2015 LVEF 40 to 45%, hypokinesis of mid apical anterolateral myocardium, gr1dd.  Admitted 10/2017 with acute on chronic heart failure.  Echo at that time with LVEF 15-20%.  He was diuresed and started on Entresto.  LHC with no coronary disease.  Repeat echo 02/2018 with improvement in LVEF 30%.  He was started on Entresto, carvedilol, hydralazine.  Repeat echo 05/2018 improvement in LVEF 50 to 55%.  Last saw Dr. Oval Linsey 01/06/2022 doing overall well from cardiac perspective.  He was relatively hypotensive at 106/60 but asymptomatic.  Updated echocardiogram for monitoring 01/2022 revealed LVEF 55-60%, mild LVH, no significant valvular abnormalities.  He presents today for follow-up. He has been doing overall well since last seen. Recently came back from a trip to [place]. Reports no shortness of breath nor dyspnea on exertion. Reports no chest pain, pressure, or tightness. No edema, orthopnea, PND. Reports no palpitations.  Does note some numbness in his feet. He feels this is more triggered by greasy foods and some meats. He does avoid fried or greasy foods. Did not he recently got back on Ozempic after being off due to shortage and insurance changes.   EKGs/Labs/Other Studies Reviewed:   The following studies were reviewed today: Echo 01/2022   1. Left ventricular ejection fraction, by estimation, is  55 to 60%. The  left ventricle has normal function. The left ventricle has no regional  wall motion abnormalities. There is mild left ventricular hypertrophy.  Left ventricular diastolic parameters  are indeterminate.   2. Right ventricular systolic function is normal. The right ventricular  size is mildly enlarged. Tricuspid regurgitation signal is inadequate for  assessing PA pressure.    3. The mitral valve is normal in structure. No evidence of mitral valve  regurgitation. No evidence of mitral stenosis.   4. The aortic valve is tricuspid. Aortic valve regurgitation is not  visualized. No aortic stenosis is present.   5. The inferior vena cava is normal in size with greater than 50%  respiratory variability, suggesting right atrial pressure of 3 mmHg.   Comparison(s): EF 50%, mild LVH. Echo 06/11/18: Study Conclusions   - Left ventricle: The cavity size was normal. Wall thickness was   increased in a pattern of mild LVH. Systolic function was normal.   The estimated ejection fraction was in the range of 50% to 55%.   Wall motion was normal; there were no regional wall motion   abnormalities. Doppler parameters are consistent with abnormal   left ventricular relaxation (grade 1 diastolic dysfunction).   Impressions:   - Definity used; low normal to mildly reduced LV systolic function   (EF 50); mild LVH; mild diastolic dysfunction.   Echo 03/12/18: Study Conclusions   - Left ventricle: Apical window is foreshoertened some,   particularly in 2 chamber views. LVEF is approximately 30%with   diffuse hypokinesis. In comparison to images from December 2018   there is no significant change. The cavity size was mildly   dilated. Wall thickness was increased in a pattern of mild LVH.   LHC/RHC 11/13/17: Large, tortuous coronary arteries without angiographically significant coronary artery disease. Normal left and right heart filling pressures. Normal pulmonary artery pressure. Low normal Fick cardiac index.   RA 6, RV 32/9, PA 26/15, mean PA 19, PCWP 12  Echo 10/23/15: Study Conclusions   - Left ventricle: The cavity size was normal. Wall thickness was   increased in a pattern of mild LVH. Systolic function was mildly   to moderately reduced. The estimated ejection fraction was in the   range of 40% to 45%. There is hypokinesis of the   mid-apicalanterolateral  myocardium. Doppler parameters are   consistent with abnormal left ventricular relaxation (grade 1   diastolic dysfunction).   EKG:  EKG is ordered today.  The ekg ordered today demonstrates NSR 75 bpm with sinus arrhythmia. No acute ST/T wave changes.   Recent Labs: 01/06/2022: ALT 21; BUN 38; Creatinine, Ser 2.11; Potassium 4.8; Sodium 140  Recent Lipid Panel    Component Value Date/Time   CHOL 172 01/06/2022 1051   TRIG 161 (H) 01/06/2022 1051   HDL 40 01/06/2022 1051   CHOLHDL 4.3 01/06/2022 1051   CHOLHDL 5.0 12/18/2010 0425   VLDL 70 (H) 12/18/2010 0425   LDLCALC 104 (H) 01/06/2022 1051   LDLDIRECT 70 12/24/2019 1043   Home Medications   Current Meds  Medication Sig   allopurinol (ZYLOPRIM) 300 MG tablet Take 300 mg by mouth daily.   Ascorbic Acid (VITAMIN C PO) Take 1 tablet by mouth daily as needed (immune support).   aspirin EC 81 MG tablet Take 81 mg by mouth daily.   carvedilol (COREG) 25 MG tablet TAKE 1 TABLET BY MOUTH TWICE A DAY (Patient taking differently: Take 25 mg by mouth 2 (two) times  daily with a meal.)   furosemide (LASIX) 20 MG tablet Take 1 tablet (20 mg total) by mouth daily. Please keep your upcoming appointment for refills.   gabapentin (NEURONTIN) 300 MG capsule Take 300 mg by mouth at bedtime.   hydrALAZINE (APRESOLINE) 50 MG tablet TAKE 1 TABLET BY MOUTH THREE TIMES A DAY (Patient taking differently: Take 50 mg by mouth 3 (three) times daily.)   Insulin Degludec (TRESIBA [place]) Inject into the skin. 70 units in the am daily   lovastatin (MEVACOR) 40 MG tablet Take 40 mg by mouth at bedtime.   metFORMIN (GLUCOPHAGE) 500 MG tablet Take 500 mg by mouth 2 (two) times daily with a meal.   omeprazole (PRILOSEC) 40 MG capsule Take 40 mg by mouth daily.   OZEMPIC, 1 MG/DOSE, 2 MG/1.5ML SOPN Inject 1 mg as directed every Friday.   potassium chloride SA (K-DUR,KLOR-CON) 20 MEQ tablet Take 20 mEq by mouth daily.    sacubitril-valsartan (ENTRESTO) 24-26 MG Take 1  tablet by mouth 2 (two) times daily.     Review of Systems      All other systems reviewed and are otherwise negative except as noted above.  Physical Exam    VS:  BP (!) 142/88   Pulse 75   Ht 5' 9.5" (1.765 m)   Wt 279 lb (126.6 kg)   BMI 40.61 kg/m  , BMI Body mass index is 40.61 kg/m.  Wt Readings from Last 3 Encounters:  12/30/22 279 lb (126.6 kg)  01/06/22 275 lb 8 oz (125 kg)  01/11/21 263 lb (119.3 kg)    GEN: Well nourished, overweight.  well developed, in no acute distress. HEENT: normal. Neck: Supple, no JVD, carotid bruits, or masses. Cardiac: RRR, no murmurs, rubs, or gallops. No clubbing, cyanosis, edema.  Radials/PT 2+ and equal bilaterally.  Respiratory:  Respirations regular and unlabored, clear to auscultation bilaterally. GI: Soft, nontender, nondistended. MS: No deformity or atrophy. Skin: Warm and dry, no rash. Neuro:  Strength and sensation are intact. Psych: Normal affect.  Assessment & Plan    Chronic systolic and diastolic heart failure- Prior LHC with no CAD. Recovered LVEF by echo 06/2022. Euvolemic and well compensated on exam. Continue Lasix 96m QD, Coreg 277mBID, Hydralaizne 5032mID, Entresto 24-46m68mD. Low sodium diet, fluid restriction <2L, and daily weights encouraged. Educated to contact our office for weight gain of 2 lbs overnight or 5 lbs in one week.   HTN- Elevated in clinic 168/98 improved to 142/88 without intervention. Has not yet taken his medication yet today. We will continue present regimen and he will check at home after medications. Check in via phone call in one week. If BP not at goal at that time consider increased dose Entresto vs Hydralazine. Discussed to monitor BP at home at least 2 hours after medications and sitting for 5-10 minutes.   Hyperlipidemia- Has labs upcoming with PCP. Continue Lovastatin.   DM2 / Obesity- Weight loss via diet and exercise encouraged. Discussed the impact being overweight would have on  cardiovascular risk. Encouraged to discuss increasing Ozempic to 2mg 31me with his PCP as would require adjustment of insulin.   HYPERTENSION CONTROL Vitals:   12/30/22 0844 12/30/22 0905  BP: (!) 168/98 (!) 142/88    The patient's blood pressure is elevated above target today.  In order to address the patient's elevated BP: Follow up with general cardiology has been recommended.; Blood pressure will be monitored at home to determine if medication changes need  to be made.         Disposition: Follow up in 1 year(s) with Skeet Latch, MD or APP.  Signed, Loel Dubonnet, NP 12/30/2022, 10:31 AM [place]

## 2023-01-05 ENCOUNTER — Telehealth (HOSPITAL_BASED_OUTPATIENT_CLINIC_OR_DEPARTMENT_OTHER): Payer: Self-pay | Admitting: Family

## 2023-01-05 NOTE — Telephone Encounter (Signed)
Called Mr. Lax to check in on his blood pressure.   He has been monitoring at home - notes he did have to get a new machine. Readings have been 142/93, 132/81 when taken at least two hours after medication.  He prefers to continue monitoring and will contact us if readings are consistently >130. If so, will plan to increase Entresto.   Loel Dubonnet, NP

## 2023-02-13 LAB — LAB REPORT - SCANNED: EGFR: 33

## 2023-03-23 ENCOUNTER — Other Ambulatory Visit: Payer: Self-pay | Admitting: Internal Medicine

## 2023-03-23 DIAGNOSIS — N6320 Unspecified lump in the left breast, unspecified quadrant: Secondary | ICD-10-CM

## 2023-03-31 ENCOUNTER — Other Ambulatory Visit (HOSPITAL_BASED_OUTPATIENT_CLINIC_OR_DEPARTMENT_OTHER): Payer: Self-pay | Admitting: Cardiovascular Disease

## 2023-03-31 NOTE — Telephone Encounter (Signed)
Rx request sent to pharmacy.  

## 2023-04-04 ENCOUNTER — Encounter: Payer: Self-pay | Admitting: Internal Medicine

## 2023-04-12 ENCOUNTER — Ambulatory Visit
Admission: RE | Admit: 2023-04-12 | Discharge: 2023-04-12 | Disposition: A | Discharge status: Home / Self Care | Payer: PRIVATE HEALTH INSURANCE | Source: Ambulatory Visit | Attending: Internal Medicine | Admitting: Internal Medicine

## 2023-04-12 ENCOUNTER — Ambulatory Visit
Admission: RE | Admit: 2023-04-12 | Discharge: 2023-04-12 | Disposition: A | Discharge status: Home / Self Care | Payer: BC Managed Care – PPO | Source: Ambulatory Visit | Attending: Internal Medicine | Admitting: Internal Medicine

## 2023-04-12 ENCOUNTER — Other Ambulatory Visit (HOSPITAL_COMMUNITY)
Admission: RE | Admit: 2023-04-12 | Discharge: 2023-04-12 | Disposition: A | Discharge status: Home / Self Care | Payer: PRIVATE HEALTH INSURANCE | Source: Ambulatory Visit | Attending: Internal Medicine | Admitting: Internal Medicine

## 2023-04-12 ENCOUNTER — Other Ambulatory Visit: Payer: Self-pay | Admitting: Internal Medicine

## 2023-04-12 DIAGNOSIS — N6320 Unspecified lump in the left breast, unspecified quadrant: Secondary | ICD-10-CM

## 2023-04-12 HISTORY — PX: BREAST BIOPSY: SHX20

## 2023-04-14 LAB — SURGICAL PATHOLOGY

## 2023-06-07 ENCOUNTER — Other Ambulatory Visit (HOSPITAL_BASED_OUTPATIENT_CLINIC_OR_DEPARTMENT_OTHER): Payer: Self-pay | Admitting: Cardiovascular Disease

## 2023-08-18 ENCOUNTER — Emergency Department (HOSPITAL_COMMUNITY)
Admission: EM | Admit: 2023-08-18 | Discharge: 2023-08-18 | Disposition: A | Discharge status: Home / Self Care | Payer: BC Managed Care – PPO | Attending: Emergency Medicine | Admitting: Emergency Medicine

## 2023-08-18 ENCOUNTER — Other Ambulatory Visit: Payer: Self-pay

## 2023-08-18 ENCOUNTER — Emergency Department (HOSPITAL_COMMUNITY): Payer: BC Managed Care – PPO

## 2023-08-18 DIAGNOSIS — S161XXA Strain of muscle, fascia and tendon at neck level, initial encounter: Secondary | ICD-10-CM | POA: Diagnosis not present

## 2023-08-18 DIAGNOSIS — S199XXA Unspecified injury of neck, initial encounter: Secondary | ICD-10-CM | POA: Diagnosis present

## 2023-08-18 DIAGNOSIS — X500XXA Overexertion from strenuous movement or load, initial encounter: Secondary | ICD-10-CM | POA: Insufficient documentation

## 2023-08-18 DIAGNOSIS — Z7982 Long term (current) use of aspirin: Secondary | ICD-10-CM | POA: Insufficient documentation

## 2023-08-18 DIAGNOSIS — E119 Type 2 diabetes mellitus without complications: Secondary | ICD-10-CM | POA: Insufficient documentation

## 2023-08-18 DIAGNOSIS — Z7984 Long term (current) use of oral hypoglycemic drugs: Secondary | ICD-10-CM | POA: Diagnosis not present

## 2023-08-18 DIAGNOSIS — Z794 Long term (current) use of insulin: Secondary | ICD-10-CM | POA: Diagnosis not present

## 2023-08-18 DIAGNOSIS — I509 Heart failure, unspecified: Secondary | ICD-10-CM | POA: Diagnosis not present

## 2023-08-18 DIAGNOSIS — I110 Hypertensive heart disease with heart failure: Secondary | ICD-10-CM | POA: Diagnosis not present

## 2023-08-18 DIAGNOSIS — Z79899 Other long term (current) drug therapy: Secondary | ICD-10-CM | POA: Diagnosis not present

## 2023-08-18 MED ORDER — METHOCARBAMOL 500 MG PO TABS
500.0000 mg | ORAL_TABLET | Freq: Once | ORAL | Status: AC
  Administered 2023-08-18: 500 mg via ORAL
  Filled 2023-08-18: qty 1

## 2023-08-18 MED ORDER — LIDOCAINE 5 % EX PTCH
1.0000 | MEDICATED_PATCH | CUTANEOUS | Status: DC
  Administered 2023-08-18: 1 via TRANSDERMAL
  Filled 2023-08-18: qty 1

## 2023-08-18 MED ORDER — LIDOCAINE 5 % EX PTCH: 1.0000 | MEDICATED_PATCH | CUTANEOUS | 0 refills | Status: AC

## 2023-08-18 MED ORDER — NAPROXEN 500 MG PO TABS: 500.0000 mg | ORAL_TABLET | Freq: Two times a day (BID) | ORAL | 0 refills | Status: AC

## 2023-08-18 MED ORDER — KETOROLAC TROMETHAMINE 15 MG/ML IJ SOLN
15.0000 mg | Freq: Once | INTRAMUSCULAR | Status: AC
  Administered 2023-08-18: 15 mg via INTRAMUSCULAR
  Filled 2023-08-18: qty 1

## 2023-08-18 MED ORDER — METHOCARBAMOL 500 MG PO TABS: 500.0000 mg | ORAL_TABLET | Freq: Two times a day (BID) | ORAL | 0 refills | Status: AC | PRN

## 2023-08-18 NOTE — ED Provider Notes (Signed)
[place] EMERGENCY DEPARTMENT AT [facility] Provider Note   CSN: [id number] Arrival date & time: 08/18/23  0006     History  Chief Complaint  Patient presents with   Neck Pain     Miguel Ho is a 62 y.o. male.  62 year old male presents to the emergency department for evaluation of left posterior lateral neck pain.  He states the pain has been constant and worsening over the past 2 days.  The day prior to onset of his pain, he reports he was moving some furniture.  His pain has been worse over the past 24 hours with an increasing sharp pain with certain movements and increased tightness to his posterior neck.  Has tried Motrin on 1 occasion with some mild to moderate improvement. Denies headache, blurry vision, vision loss, hearing loss, extremity numbness or paresthesias, extremity weakness, lightheadedness, syncope/near syncope.      Home Medications Prior to Admission medications   Medication Sig Start Date End Date Taking? Authorizing Provider  lidocaine (LIDODERM) 5 % Place 1 patch onto the skin daily. Remove & Discard patch within 12 hours or as directed by MD 08/18/23  Yes Antony Madura, PA-C  methocarbamol (ROBAXIN) 500 MG tablet Take 1 tablet (500 mg total) by mouth every 12 (twelve) hours as needed for muscle spasms. 08/18/23  Yes Antony Madura, PA-C  naproxen (NAPROSYN) 500 MG tablet Take 1 tablet (500 mg total) by mouth 2 (two) times daily with a meal. 08/18/23  Yes Antony Madura, PA-C  allopurinol (ZYLOPRIM) 300 MG tablet Take 300 mg by mouth daily.    [provider]  Ascorbic Acid (VITAMIN C PO) Take 1 tablet by mouth daily as needed (immune support).    [provider]  aspirin EC 81 MG tablet Take 81 mg by mouth daily.    [provider]  carvedilol (COREG) 25 MG tablet TAKE 1 TABLET BY MOUTH TWICE A DAY Patient taking differently: Take 25 mg by mouth 2 (two) times daily with a meal. 02/18/19   Chilton Si, MD  furosemide  (LASIX) 20 MG tablet TAKE 1 TABLET BY MOUTH EVERY DAY 06/08/23   Chilton Si, MD  gabapentin (NEURONTIN) 300 MG capsule Take 300 mg by mouth at bedtime. 09/14/17   [provider]  hydrALAZINE (APRESOLINE) 50 MG tablet TAKE 1 TABLET BY MOUTH THREE TIMES A DAY Patient taking differently: Take 50 mg by mouth 3 (three) times daily. 05/04/20   Chilton Si, MD  Insulin Degludec (TRESIBA [place]) Inject into the skin. 70 units in the am daily    [provider]  lovastatin (MEVACOR) 40 MG tablet Take 40 mg by mouth at bedtime.    [provider]  metFORMIN (GLUCOPHAGE) 500 MG tablet Take 500 mg by mouth 2 (two) times daily with a meal.    [provider]  omeprazole (PRILOSEC) 40 MG capsule Take 40 mg by mouth daily. 12/20/21   [provider]  OZEMPIC, 1 MG/DOSE, 2 MG/1.5ML SOPN Inject 1 mg as directed every Friday. 01/07/22   Chilton Si, MD  potassium chloride SA (K-DUR,KLOR-CON) 20 MEQ tablet Take 20 mEq by mouth daily.     [provider]  sacubitril-valsartan (ENTRESTO) 24-26 MG Take 1 tablet by mouth 2 (two) times daily. 03/17/20   Chilton Si, MD      Allergies    Patient has no known allergies.    Review of Systems   Review of Systems Ten systems reviewed and are negative for  acute change, except as noted in the HPI.    Physical Exam Updated Vital Signs BP 116/79   Pulse 99   Temp 98.6 F (37 C) (Oral)   Resp 19   SpO2 99%   Physical Exam Vitals and nursing note reviewed.  Constitutional:      General: He is not in acute distress.    Appearance: He is well-developed. He is not diaphoretic.     Comments: Nontoxic appearing and in NAD  HENT:     Head: Normocephalic and atraumatic.  Eyes:     General: No scleral icterus.    Conjunctiva/sclera: Conjunctivae normal.  Neck:      Comments: No bony deformities, step-offs, crepitus to the cervical midline Cardiovascular:     Rate and Rhythm: Normal rate and  regular rhythm.     Pulses: Normal pulses.     Comments: Distal radial pulse 2+ b/l Pulmonary:     Effort: Pulmonary effort is normal. No respiratory distress.     Comments: Respirations even and unlabored Musculoskeletal:        General: Normal range of motion.     Cervical back: Normal range of motion.  Skin:    General: Skin is warm and dry.     Coloration: Skin is not pale.     Findings: No erythema or rash.  Neurological:     Mental Status: He is alert and oriented to person, place, and time.     Motor: No weakness.     Coordination: Coordination normal.     Comments: Patient has equal grip strength bilaterally with 5/5 strength against resistance in all major muscle groups bilaterally. Normal shoulder shrug against resistance. Sensation to light touch intact.   Psychiatric:        Behavior: Behavior normal.     ED Results / Procedures / Treatments   Labs (all labs ordered are listed, but only abnormal results are displayed) Labs Reviewed - No data to display  EKG None  Radiology CT Cervical Spine Wo Contrast  Result Date: 08/18/2023 CLINICAL DATA:  Neck strain 2 days ago with persistent pain, initial encounter EXAM: CT CERVICAL SPINE WITHOUT CONTRAST TECHNIQUE: Multidetector CT imaging of the cervical spine was performed without intravenous contrast. Multiplanar CT image reconstructions were also generated. RADIATION DOSE REDUCTION: This exam was performed according to the departmental dose-optimization program which includes automated exposure control, adjustment of the mA and/or kV according to patient size and/or use of iterative reconstruction technique. COMPARISON:  None Available. FINDINGS: Alignment: Within normal limits. Skull base and vertebrae: 7 cervical segments are well visualized. Vertebral body height is well maintained. No acute fracture or acute facet abnormality is noted. Osteophytic changes and facet hypertrophic changes are noted. Soft tissues and spinal  canal: Surrounding soft tissue structures are within normal limits. Upper chest: Visualized lung apices are unremarkable. Other: None IMPRESSION: Multilevel degenerative change without acute abnormality. Electronically Signed   By: Alcide Clever M.D.   On: 08/18/2023 01:35    Procedures Procedures    Medications Ordered in ED Medications  lidocaine (LIDODERM) 5 % 1 patch (1 patch Transdermal Patch Applied 08/18/23 0150)  ketorolac (TORADOL) 15 MG/ML injection 15 mg (15 mg Intramuscular Given 08/18/23 0149)  methocarbamol (ROBAXIN) tablet 500 mg (500 mg Oral Given 08/18/23 0149)    ED Course/ Medical Decision Making/ A&P Clinical Course as of 08/18/23 0259  Fri Aug 18, 2023  8469 Left posterior lateral neck pain which began after moving furniture.  Symptoms suspicious for  muscle strain/sprain and spasm.  Will treat supportively in the emergency department pending imaging.  Opted for CT given low yield from x-ray as well as patient's age, risk factors.  I do not have present concern for vascular cause of symptoms given lack of lightheadedness, extremity weakness, hearing or vision changes, headache. Neurovascularly intact on exam. [KH]  0200 Patient feeling better after medications. No acute/emergent pathology noted on C-spine CT. [KH]    Clinical Course User Index [KH] Antony Madura, PA-C                                 Medical Decision Making Amount and/or Complexity of Data Reviewed Radiology: ordered.  Risk Prescription drug management.   This patient presents to the ED for concern of neck pain, this involves an extensive number of treatment options, and is a complaint that carries with it a high risk of complications and morbidity.  The differential diagnosis includes sprain/strain vs fracture vs lytic lesion vs herniated disc vs vascular dissection   Co morbidities that complicate the patient evaluation  HTN HLD OSA DM CHF   Additional history obtained:  Additional  history obtained from spouse, at bedside External records from outside source obtained and reviewed including reassuring echocardiogram in March 2023; LVEF 55-60%   Imaging Studies ordered:  I ordered imaging studies including CT C-spine  I independently visualized and interpreted imaging which showed degenerative changes w/o other acute abnormality I agree with the radiologist interpretation   Cardiac Monitoring:  The patient was maintained on a cardiac monitor.  I personally viewed and interpreted the cardiac monitored which showed an underlying rhythm of: NSR   Medicines ordered and prescription drug management:  I ordered medication including Toradol, Robaxin, Lidoderm patch for neck pain  Reevaluation of the patient after these medicines showed that the patient improved I have reviewed the patients home medicines and have made adjustments as needed   Test Considered:  CTA neck - low suspicion for vascular pathology given history and exam.   Problem List / ED Course:  As above   Reevaluation:  After the interventions noted above, I reevaluated the patient and found that they have :improved   Social Determinants of Health:  Good social support   Dispostion:  After consideration of the diagnostic results and the patients response to treatment, I feel that the patent would benefit from supportive care with NSAID, muscle relaxers, rest. Encouraged PCP recheck in 1 week. Return precautions discussed and provided. Patient discharged in stable condition with no unaddressed concerns.         Final Clinical Impression(s) / ED Diagnoses Final diagnoses:  Cervical muscle strain, initial encounter    Rx / DC Orders ED Discharge Orders          Ordered    methocarbamol (ROBAXIN) 500 MG tablet  Every 12 hours PRN        08/18/23 0223    lidocaine (LIDODERM) 5 %  Every 24 hours        08/18/23 0223    naproxen (NAPROSYN) 500 MG tablet  2 times daily with meals         08/18/23 0223              Antony Madura, PA-C 08/18/23 0259    Mesner, Barbara Cower, MD 08/18/23 [phone]

## 2023-08-18 NOTE — Discharge Instructions (Addendum)
Alternate ice and heat to areas of injury 3-4 times per day to limit inflammation and spasm.  Avoid strenuous activity and heavy lifting.  We recommend consistent use of naproxen in addition to Robaxin for muscle spasms.  You may also try Lidoderm patches, as prescribed.  We recommend follow-up with a primary care doctor to ensure resolution of symptoms.  Return to the ED for any new or concerning symptoms.

## 2023-08-18 NOTE — ED Triage Notes (Signed)
Patient reports left lateral neck muscle ache injured 2 days ago , patient stated he "pulled" a muscle with tightness , pain increases with movement / changing positions .

## 2023-12-07 ENCOUNTER — Other Ambulatory Visit (HOSPITAL_BASED_OUTPATIENT_CLINIC_OR_DEPARTMENT_OTHER): Payer: Self-pay | Admitting: Cardiovascular Disease

## 2024-06-27 ENCOUNTER — Other Ambulatory Visit (HOSPITAL_COMMUNITY): Payer: Self-pay

## 2024-12-08 ENCOUNTER — Other Ambulatory Visit (HOSPITAL_BASED_OUTPATIENT_CLINIC_OR_DEPARTMENT_OTHER): Payer: Self-pay | Admitting: Cardiovascular Disease

## 2024-12-20 ENCOUNTER — Other Ambulatory Visit: Payer: Self-pay | Admitting: Internal Medicine

## 2024-12-20 DIAGNOSIS — M79604 Pain in right leg: Secondary | ICD-10-CM

## 2024-12-20 DIAGNOSIS — E119 Type 2 diabetes mellitus without complications: Secondary | ICD-10-CM
